# Patient Record
Sex: Female | Born: 2000 | Race: Black or African American | Hispanic: No | Marital: Single | Smoking: Never smoker
Health system: Southern US, Community
[De-identification: ages and names within clinical notes are randomized; demographics above are authoritative.]

## PROBLEM LIST (undated history)

## (undated) DIAGNOSIS — J45909 Unspecified asthma, uncomplicated: Secondary | ICD-10-CM

## (undated) HISTORY — DX: Unspecified asthma, uncomplicated: J45.909

## (undated) HISTORY — PX: WISDOM TOOTH EXTRACTION: SHX21

---
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
## 2019-03-26 ENCOUNTER — Ambulatory Visit (INDEPENDENT_AMBULATORY_CARE_PROVIDER_SITE_OTHER): Payer: Medicaid Other | Admitting: Family Medicine

## 2019-03-26 ENCOUNTER — Other Ambulatory Visit: Payer: Self-pay

## 2019-03-26 ENCOUNTER — Encounter: Payer: Self-pay | Admitting: Family Medicine

## 2019-03-26 VITALS — BP 100/60 | HR 71 | Temp 99.1°F | Resp 16 | Ht 64.0 in | Wt 120.2 lb

## 2019-03-26 DIAGNOSIS — J45990 Exercise induced bronchospasm: Secondary | ICD-10-CM | POA: Diagnosis not present

## 2019-03-26 DIAGNOSIS — Z025 Encounter for examination for participation in sport: Secondary | ICD-10-CM | POA: Diagnosis not present

## 2019-03-26 NOTE — Assessment & Plan Note (Signed)
Stable, uncomplicated with rare flare up only No other known asthma triggers On Albuterol PRN use  Plan Monitor asthma symptoms in future, if recurrent flare up in future limiting her function then may need additional therapy maintenance issue.

## 2019-03-26 NOTE — Progress Notes (Signed)
Subjective:    Patient ID: Barbara Barnes, female    DOB: September 04, 2001, 18 y.o.   MRN: [id number]  Barbara Barnes is a 18 y.o. female presenting on 03/26/2019 for [place] (physical forms)  Previous patient of [facility]. 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 They have requested Sports Physical and document completed prior to starting practice. - All standard sports physical questions completed per provided handout, only positive question answered yes was problem with breathing during exercise with known history of Asthma, mostly exercise induced but it is infrequent by report < 1 x month flare, usually mild and self resolved with use of albuterol PRN.  No known prior history of concussion, head trauma, significant known joint problem or fracture, surgery, chest pain, syncope related to exertion, family history of sudden cardiac death, seizures, among other questions (all negative)  Lifestyle Balanced diet Regular exercise, plays basketball Will be sophomore at [employer] this Fall 2020 Doing well in school She feels safe in relationships at home and school Denies any exposure to drugs tobacco alcohol On OCP for birth control, followed by [place] Maintenance: Will check [facility] Vaccine record - upon review today: Only missing Meningitis B Vaccine, optional, discussed briefly with patient and gave her info on this vaccine to consider it. She is UTD on Tdap and other vaccines at this time.  Depression screen PHQ 2/9 03/26/2019  Decreased Interest 0  Down, Depressed, Hopeless 0  PHQ - 2 Score 0    Past Medical History:  Diagnosis Date  . Asthma    Past Surgical History:  Procedure Laterality Date  . [place] EXTRACTION  2018  . WISDOM TOOTH EXTRACTION   2019   Social History   Socioeconomic History  . Marital status: Single    Spouse name: Not on file  . Number of children: Not on file  . Years of education: Not on file  . Highest education level: Not on file  Occupational History  . Not on file  Social Needs  . Financial resource strain: Not on file  . Food insecurity    Worry: Not on file    Inability: Not on file  . Transportation needs    Medical: Not on file    Non-medical: Not on file  Tobacco Use  . Smoking status: Never Smoker  . Smokeless tobacco: Never Used  Substance and Sexual Activity  . Alcohol use: No  . Drug use: No  . Sexual activity: Never    Birth control/protection: Pill    Comment: Never sexually active  Lifestyle  . Physical activity    Days per week: 7 days    Minutes per session: 120 min  . Stress: Not at all  Relationships  . Social connections    Talks on phone: More than three times a week    Gets together: More than three times a week    Attends religious service: More than 4 times per year    Active member of club or organization: Yes    Attends meetings of clubs or organizations: More than 4 times per year    Relationship status: Not on file  . Intimate partner violence    Fear of current or ex partner: No    Emotionally abused: No  Physically abused: No    Forced sexual activity: No  Other Topics Concern  . Not on file  Social History Narrative  . Not on file   History reviewed. No pertinent family history. Current Outpatient Medications on File Prior to Visit  Medication Sig  . albuterol (VENTOLIN HFA) 108 (90 Base) MCG/ACT inhaler Inhale 1 puff into the lungs every 4 (four) hours as needed for wheezing or shortness of breath.  . LO LOESTRIN FE 1 MG-10 MCG / 10 MCG tablet TAKE 1 TABLET BY MOUTH ONCE DAILY   No current facility-administered medications on file prior to visit.     Review of Systems  Constitutional: Negative for activity change, appetite change, chills,  diaphoresis, fatigue and fever.  HENT: Negative for congestion and hearing loss.   Eyes: Negative for visual disturbance.  Respiratory: Negative for cough, chest tightness, shortness of breath and wheezing.   Cardiovascular: Negative for chest pain, palpitations and leg swelling.  Gastrointestinal: Negative for abdominal pain, constipation, diarrhea, nausea and vomiting.  Genitourinary: Negative for dysuria, frequency and hematuria.  Musculoskeletal: Negative for arthralgias and neck pain.  Skin: Negative for rash.  Neurological: Negative for dizziness, weakness, light-headedness, numbness and headaches.  Hematological: Negative for adenopathy.  Psychiatric/Behavioral: Negative for behavioral problems, dysphoric mood and sleep disturbance.   Per HPI unless specifically indicated above      Objective:    BP 100/60   Pulse 71   Temp 99.1 F (37.3 C) (Oral)   Resp 16   Ht 5\' 4"  (1.626 m)   Wt 120 lb 3.2 oz (54.5 kg)   BMI 20.63 kg/m   Wt Readings from Last 3 Encounters:  03/26/19 120 lb 3.2 oz (54.5 kg) (37 %, Z= -0.34)*  09/10/18 123 lb (55.8 kg) (45 %, Z= -0.12)*  08/02/18 125 lb (56.7 kg) (50 %, Z= 0.00)*   * Growth percentiles are based on CDC (Girls, 2-20 Years) data.    Physical Exam Vitals signs and nursing note reviewed.  Constitutional:      General: She is not in acute distress.    Appearance: She is well-developed. She is not diaphoretic.     Comments: Well-appearing, comfortable, cooperative  HENT:     Head: Normocephalic and atraumatic.  Eyes:     General:        Right eye: No discharge.        Left eye: No discharge.     Conjunctiva/sclera: Conjunctivae normal.     Pupils: Pupils are equal, round, and reactive to light.  Neck:     Musculoskeletal: Normal range of motion and neck supple.     Thyroid: No thyromegaly.  Cardiovascular:     Rate and Rhythm: Normal rate and regular rhythm.     Heart sounds: Normal heart sounds. No murmur.  Pulmonary:      Effort: Pulmonary effort is normal. No respiratory distress.     Breath sounds: Normal breath sounds. No wheezing or rales.  Abdominal:     General: Bowel sounds are normal. There is no distension.     Palpations: Abdomen is soft. There is no mass.     Tenderness: There is no abdominal tenderness.  Musculoskeletal: Normal range of motion.        General: No tenderness.     Comments: Upper / Lower Extremities: - Normal muscle tone, strength bilateral upper extremities 5/5, lower extremities 5/5  Lymphadenopathy:     Cervical: No cervical adenopathy.  Skin:    General: Skin is  warm and dry.     Findings: No erythema or rash.  Neurological:     Mental Status: She is alert and oriented to person, place, and time.     Comments: Distal sensation intact to light touch all extremities  Psychiatric:        Behavior: Behavior normal.     Comments: Well groomed, good eye contact, normal speech and thoughts    Results for orders placed or performed during the hospital encounter of 11/23/17  CBC with Differential/Platelet  Result Value Ref Range   WBC 6.2 3.6 - 11.0 K/uL   RBC 4.32 3.80 - 5.20 MIL/uL   Hemoglobin 13.5 12.0 - 16.0 g/dL   HCT 16.140.2 09.635.0 - 04.547.0 %   MCV 93.1 80.0 - 100.0 fL   MCH 31.2 26.0 - 34.0 pg   MCHC 33.5 32.0 - 36.0 g/dL   RDW 40.913.1 81.111.5 - 91.414.5 %   Platelets 257 150 - 440 K/uL   Neutrophils Relative % 66 %   Neutro Abs 4.1 1.4 - 6.5 K/uL   Lymphocytes Relative 25 %   Lymphs Abs 1.5 1.0 - 3.6 K/uL   Monocytes Relative 9 %   Monocytes Absolute 0.5 0.2 - 0.9 K/uL   Eosinophils Relative 1 %   Eosinophils Absolute 0.0 0 - 0.7 K/uL   Basophils Relative 1 %   Basophils Absolute 0.0 0 - 0.1 K/uL  Miscellaneous [facility] test (send-out)  Result Value Ref Range   [facility] test code [id number]    [facility] test name HEMOGLOBIN SOLUBILITY    Misc [facility] result COMMENT       Assessment & Plan:   Problem List Items Addressed This Visit    Mild exercise-induced asthma    Stable,  uncomplicated with rare flare up only No other known asthma triggers On Albuterol PRN use  Plan Monitor asthma symptoms in future, if recurrent flare up in future limiting her function then may need additional therapy maintenance issue.      Relevant Medications   albuterol (VENTOLIN HFA) 108 (90 Base) MCG/ACT inhaler    Other Visit Diagnoses    Routine sports physical exam    -  Primary      Cleared to participate in Basketball now at school and additional sports within 1 year.  Completed form, signed, returned original to patient and we scanned copy.  Encouraged healthy balanced diet Emphasis on doing well in school, staying active Reviewed routine preventative topics for adolescent - Offered Meningitis B vaccine, return if interested   No orders of the defined types were placed in this encounter.   Follow up plan: Return in about 1 year (around 03/25/2020) for Sports physical in 1 year.  Saralyn PilarAlexander Sharol Croghan, DO [facility] 03/26/2019, 10:16 AM

## 2019-03-26 NOTE — Patient Instructions (Addendum)
Thank you for coming to the office today.  Completed sports physical today. You are cleared to participate in basketball. Good luck!  Let us know if any difficulty with asthma flare up or breathing, we can re order Albuterol inhaler or consider treatment options for preventing asthma flares in future, such as Singulair medication or daily inhaler medicine  We will check [place] Vaccine record to determine if due for any vaccines, if we identify any that are missing we can contact you and ask that you may come back for a vaccine or can be done at [facility] of residence.  Please schedule a Follow-up Appointment to: Return in about 1 year (around 03/25/2020) for Sports physical in 1 year.  If you have any other questions or concerns, please feel free to call the office or send a message through [place]. You may also schedule an earlier appointment if necessary.  Additionally, you may be receiving a survey about your experience at our office within a few days to 1 week by e-mail or mail. We value your feedback.  Nobie Putnam, DO [place]

## 2019-04-18 IMAGING — DX DG ANKLE COMPLETE 3+V*L*
3 series · 3 of 3 positions shown · non-contrast
Comparison: None.

CLINICAL DATA: Ankle injury playing basketball today.

EXAM:
LEFT ANKLE COMPLETE - 3+ VIEW

[ankle ap]
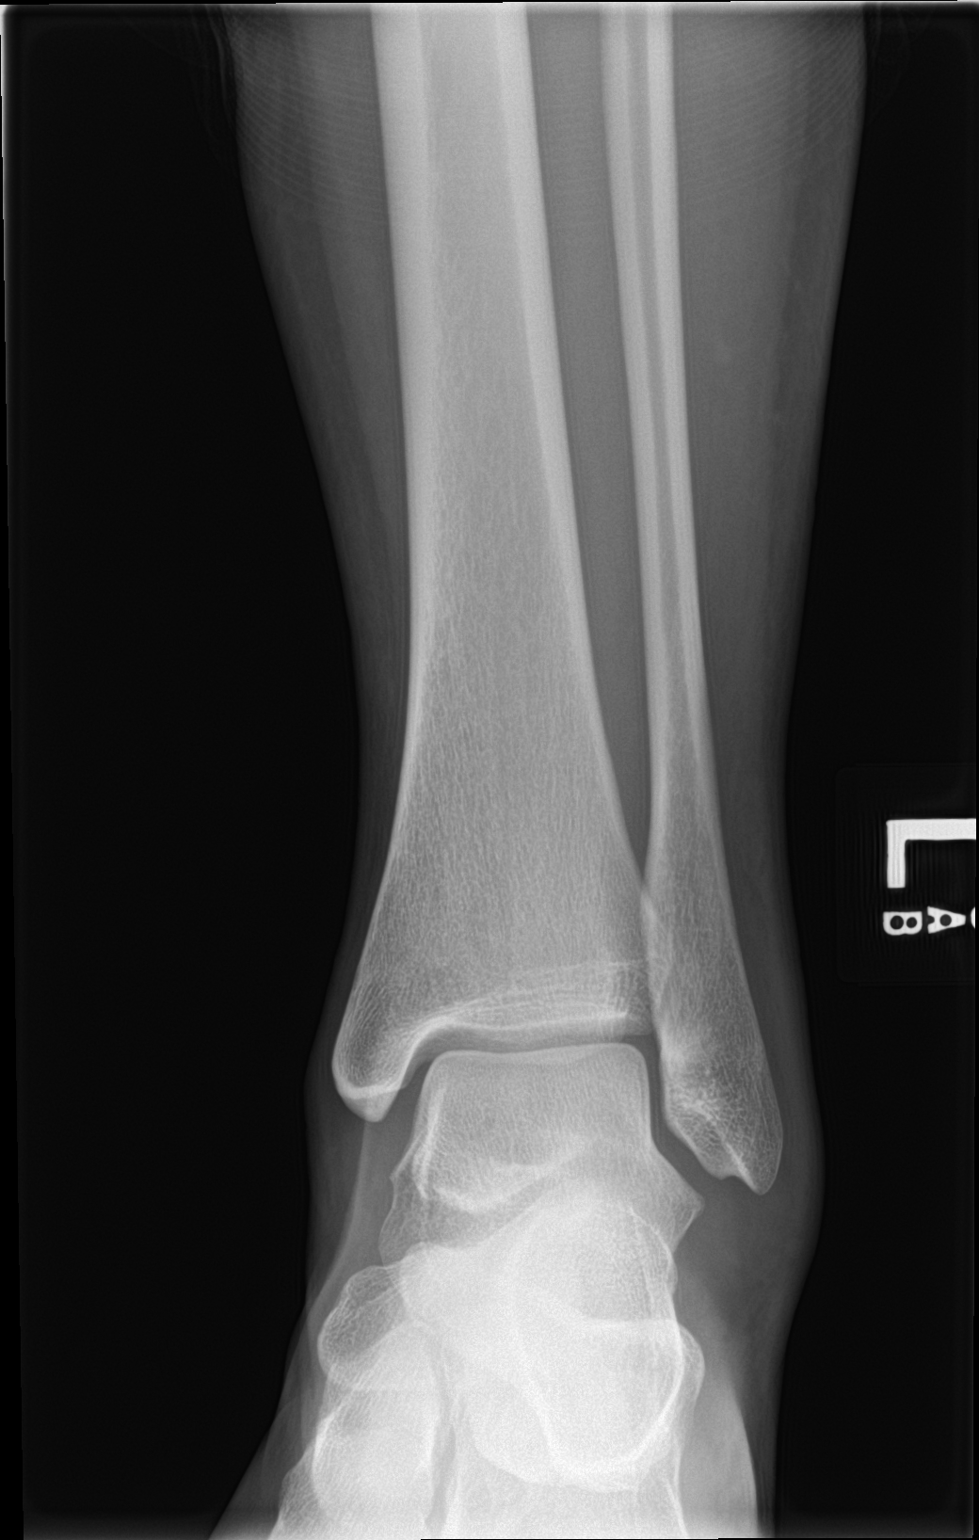

[ankle obl]
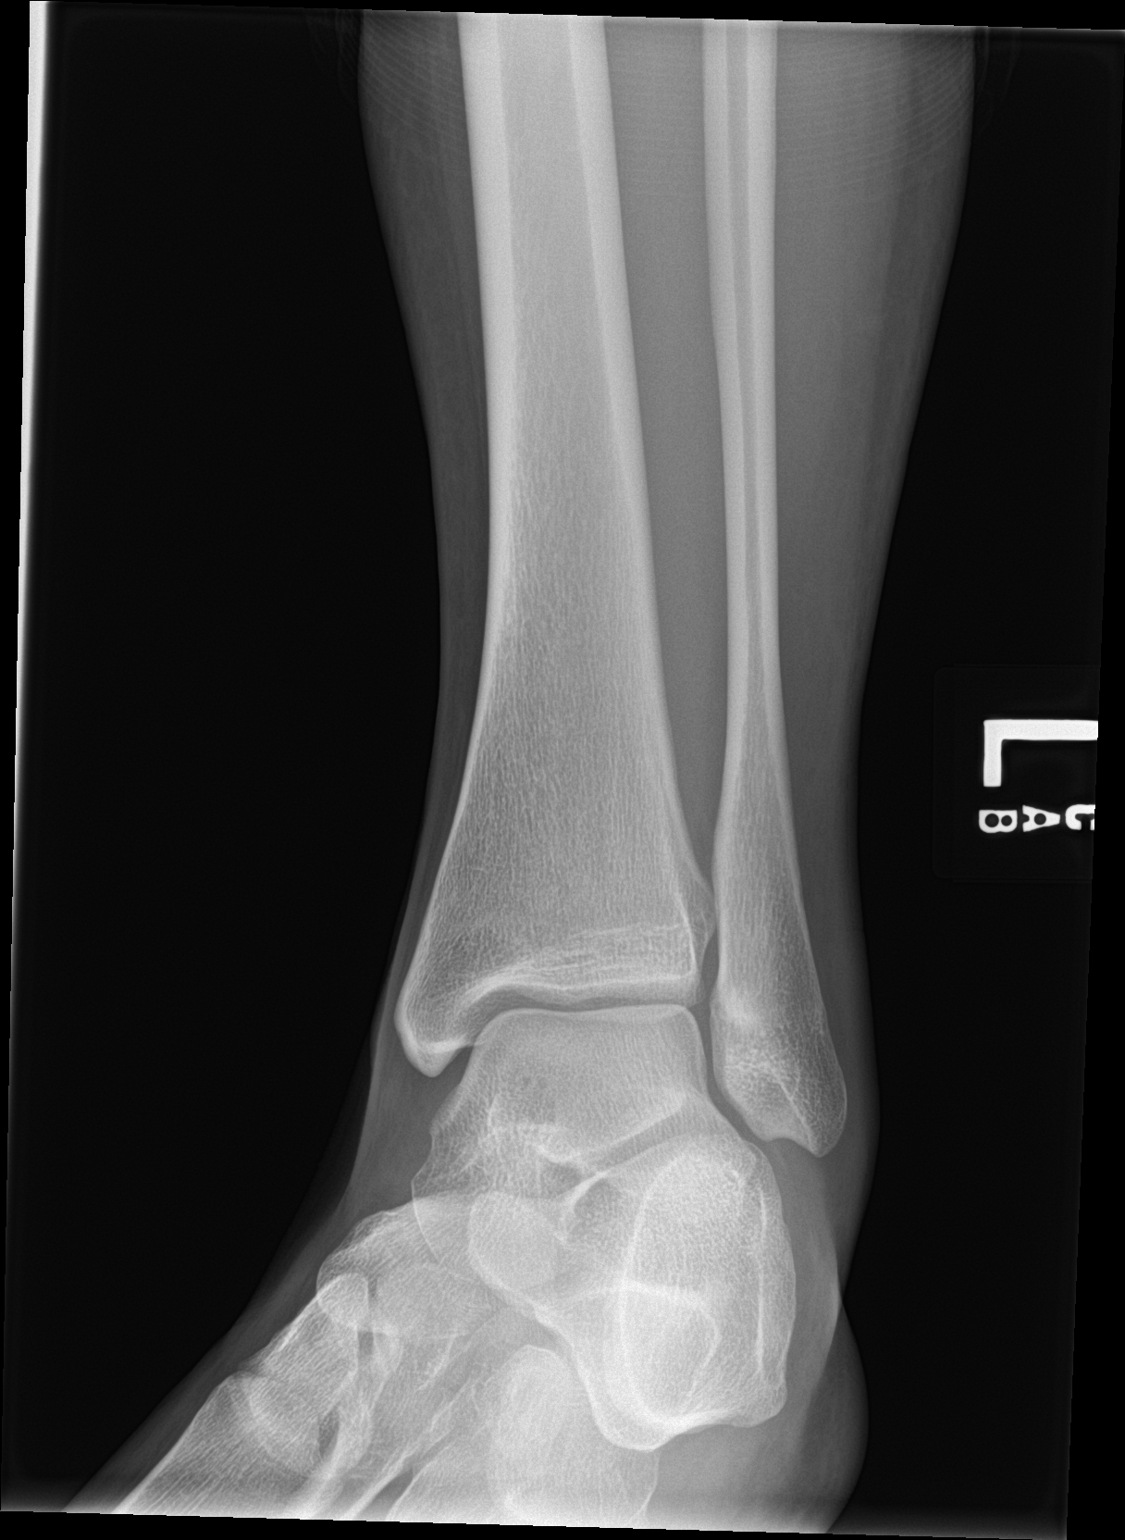

[ankle lat]
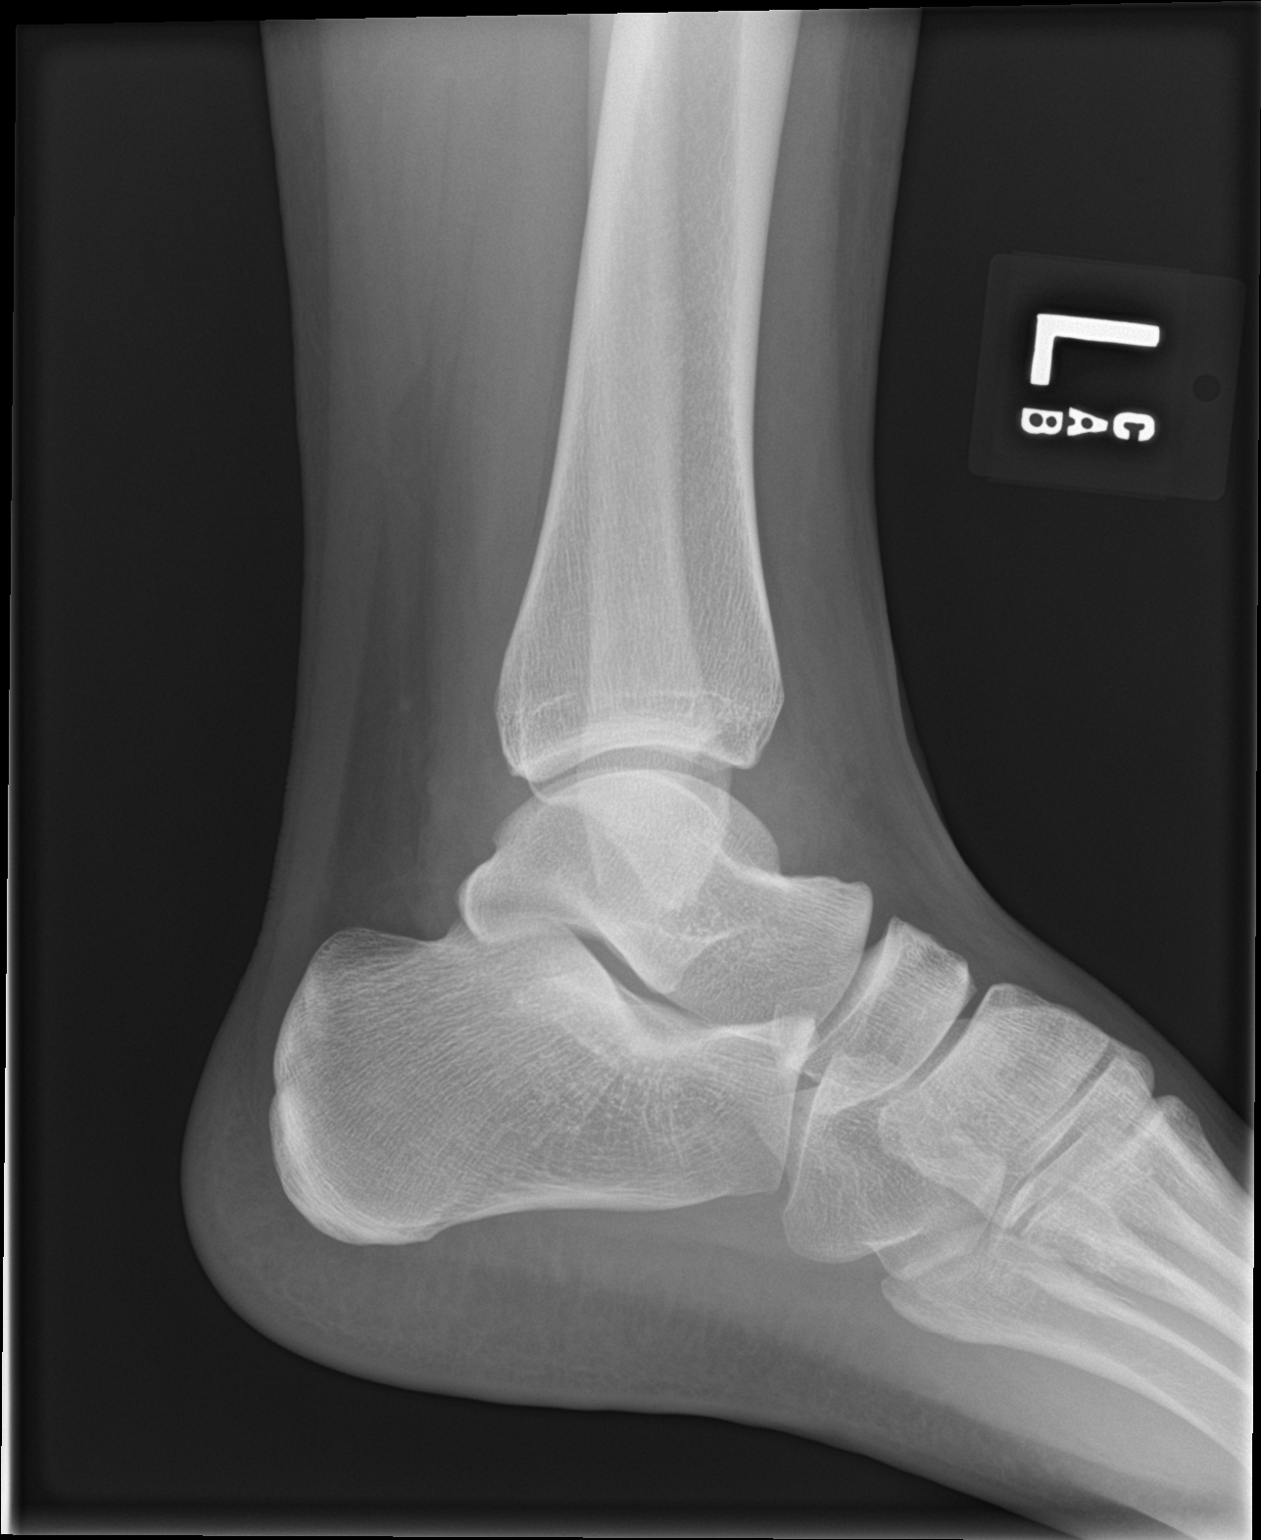

[3 of 3 positions shown; findings below may reference images not displayed]

FINDINGS: No fracture deformity nor dislocation. The ankle mortise appears
congruent and the tibiofibular syndesmosis intact. No destructive
bony lesions. Mild lateral ankle soft tissue swelling without
subcutaneous gas or radiopaque foreign bodies.
IMPRESSION: Soft tissue swelling, no acute osseous process.

## 2019-07-04 ENCOUNTER — Ambulatory Visit (INDEPENDENT_AMBULATORY_CARE_PROVIDER_SITE_OTHER): Payer: Medicaid Other | Admitting: Family Medicine

## 2019-07-04 ENCOUNTER — Other Ambulatory Visit: Payer: Self-pay

## 2019-07-04 ENCOUNTER — Encounter: Payer: Self-pay | Admitting: Family Medicine

## 2019-07-04 VITALS — BP 104/52 | HR 62 | Temp 98.4°F | Resp 16 | Ht 64.0 in | Wt 127.0 lb

## 2019-07-04 DIAGNOSIS — L301 Dyshidrosis [pompholyx]: Secondary | ICD-10-CM

## 2019-07-04 MED ORDER — TRIAMCINOLONE ACETONIDE 0.1 % EX CREA: 1.0000 "application " | TOPICAL_CREAM | Freq: Two times a day (BID) | CUTANEOUS | 1 refills | Status: DC | PRN

## 2019-07-04 NOTE — Progress Notes (Signed)
Subjective:    Patient ID: Barbara Barnes, female    DOB: [birth year], 18 y.o.   MRN: [id number]  Barbara Barnes is a 18 y.o. female presenting on 07/04/2019 for Rash (as per patient only on hand occassionally don't know how long she has it)   HPI   Bilateral Hand Rash - suspected Dyshidrotic Eczema Reports new issue onset over past several weeks unsure duration with bilateral hand having some red appearing dry skin bumps on hand mostly inner parts of fingers on side and dorsal fingers with itching and then it would dry out and flake some. Also had similar on feet before. - Regular hand hygiene with washing soap and water. She does use OTC moisturizer sometimes - Tried benadryl itch cream with some relief - History of asthma - Not tried Cortisone OTC Denies any fever chills spreading redness, pain, blister,     Depression screen [facility] 2/9 07/04/2019 03/26/2019  Decreased Interest 0 0  Down, Depressed, Hopeless 0 0  PHQ - 2 Score 0 0    Social History   Tobacco Use  . Smoking status: Never Smoker  . Smokeless tobacco: Never Used  Substance Use Topics  . Alcohol use: No  . Drug use: No    Review of Systems Per HPI unless specifically indicated above     Objective:    BP (!) 104/52   Pulse 62   Temp 98.4 F (36.9 C) (Oral)   Resp 16   Ht 5\' 4"  (1.626 m)   Wt 127 lb (57.6 kg)   BMI 21.80 kg/m   Wt Readings from Last 3 Encounters:  07/04/19 127 lb (57.6 kg) (49 %, Z= -0.02)*  03/26/19 120 lb 3.2 oz (54.5 kg) (37 %, Z= -0.34)*  09/10/18 123 lb (55.8 kg) (45 %, Z= -0.12)*   * Growth percentiles are based on CDC (Girls, 2-20 Years) data.    Physical Exam Vitals signs and nursing note reviewed.  Constitutional:      General: She is not in acute distress.    Appearance: She is well-developed. She is not diaphoretic.     Comments: Well-appearing, comfortable, cooperative  HENT:     Head: Normocephalic and atraumatic.  Eyes:     General:        Right eye: No  discharge.        Left eye: No discharge.     Conjunctiva/sclera: Conjunctivae normal.  Cardiovascular:     Rate and Rhythm: Normal rate.  Pulmonary:     Effort: Pulmonary effort is normal.  Skin:    General: Skin is warm and dry.     Findings: Rash (resolving scattered spots of dry flaking skin bilateral hands inner aspect of index finger and thumb, no erythema or raised rash) present. No erythema.  Neurological:     Mental Status: She is alert and oriented to person, place, and time.  Psychiatric:        Behavior: Behavior normal.     Comments: Well groomed, good eye contact, normal speech and thoughts    Results for orders placed or performed during the hospital encounter of 11/23/17  CBC with Differential/Platelet  Result Value Ref Range   WBC 6.2 3.6 - 11.0 K/uL   RBC 4.32 3.80 - 5.20 MIL/uL   Hemoglobin 13.5 12.0 - 16.0 g/dL   HCT 11/25/17 33.8 - 25.0 %   MCV 93.1 80.0 - 100.0 fL   MCH 31.2 26.0 - 34.0 pg   MCHC 33.5 32.0 - 36.0  g/dL   RDW 13.1 11.5 - 14.5 %   Platelets 257 150 - 440 K/uL   Neutrophils Relative % 66 %   Neutro Abs 4.1 1.4 - 6.5 K/uL   Lymphocytes Relative 25 %   Lymphs Abs 1.5 1.0 - 3.6 K/uL   Monocytes Relative 9 %   Monocytes Absolute 0.5 0.2 - 0.9 K/uL   Eosinophils Relative 1 %   Eosinophils Absolute 0.0 0 - 0.7 K/uL   Basophils Relative 1 %   Basophils Absolute 0.0 0 - 0.1 K/uL  Miscellaneous [facility] test (send-out)  Result Value Ref Range   [facility] test code [id number]    [facility] test name HEMOGLOBIN SOLUBILITY    Misc [facility] result COMMENT       Assessment & Plan:   Problem List Items Addressed This Visit    None    Visit Diagnoses    Dyshidrotic eczema    -  Primary   Relevant Medications   triamcinolone cream (KENALOG) 0.1 %      Clinically with hand dermatitis vs eczema type rash now resolving, but has episodic flare with itching No sign of infection Reassurance Trial topical steroid, rx Triamcinolone course Home eczema care -  moisturizing, tips AVS given If unresolved could consider topical antifungal based on appearance Followup if recurrence or worsening or new concerns  Meds ordered this encounter  Medications  . triamcinolone cream (KENALOG) 0.1 %    Sig: Apply 1 application topically 2 (two) times daily as needed. For 1-2 weeks only on affected areas    Dispense:  30 g    Refill:  1      Follow up plan: Return in about 2 weeks (around 07/18/2019), or if symptoms worsen or fail to improve, for rash.   Nobie Putnam, DO [facility] 07/04/2019, 12:03 PM

## 2019-07-04 NOTE — Patient Instructions (Addendum)
Thank you for coming to the office today.  The rash looks most consistent with eczema, this can flare up and get worse due to a variety of factors (excessive dry skin from bathing/showering, soaps, cold weather / indoor heaters, outdoor exposures).  Use the topical steroid creams twice a day for up to 1 week, maximum duration of use per one flare is 10 to 14 days, then STOP using it and allow skin to recover. Caution with over-use may cause lightening of the skin.   Hydrocortisone on face only and the Triamcinolone / Kenalog on body only.  Tips to reduce Eczema Flares: For baths/showers, limit bathing to every other day if you can (max 1 x daily)  Use a gentle, unscented soap and lukewarm water (hot water is most irritating to skin) Never scrub skin with too much pressure, this causes more irritation. Pat skin dry, then leave it slightly damp. DO NOT scrub it dry. Apply steroid cream to skin and rub in all the way, wait 15 min, then apply a daily moisturizer (Lubriderm, Eucerin, Aveeno). Continue daily moisturizer every day of the year (even after flare is resolved) - If you have eczema on hands or dry hands, recommend wearing any type of gloves overnight (cloth, fabric, or even nitrile/latex) to improve effect of topical moisturizer  If develops redness, honey colored crust oozing, drainage of pus, bleeding, or redness / swelling, pain, please return for re-evaluation, may have become infected after scratching.  If not resolving we can add an anti fungal medicine to the new cream if we order it.  Please schedule a Follow-up Appointment to: Return in about 2 weeks (around 07/18/2019), or if symptoms worsen or fail to improve, for rash.  If you have any other questions or concerns, please feel free to call the office or send a message through [place]. You may also schedule an earlier appointment if necessary.  Additionally, you may be receiving a survey about your experience at our office  within a few days to 1 week by e-mail or mail. We value your feedback.  Nobie Putnam, DO [place]

## 2019-08-06 ENCOUNTER — Other Ambulatory Visit: Payer: Self-pay | Admitting: Obstetrics and Gynecology

## 2019-08-06 NOTE — Telephone Encounter (Signed)
Pt overdue for annual.

## 2019-10-27 ENCOUNTER — Other Ambulatory Visit: Payer: Self-pay | Admitting: Obstetrics and Gynecology

## 2019-11-09 ENCOUNTER — Emergency Department
Admission: EM | Admit: 2019-11-09 | Discharge: 2019-11-10 | Disposition: A | Discharge status: Home / Self Care | Payer: Medicaid Other | Attending: Emergency Medicine | Admitting: Emergency Medicine

## 2019-11-09 ENCOUNTER — Other Ambulatory Visit: Payer: Self-pay

## 2019-11-09 DIAGNOSIS — B3731 Acute candidiasis of vulva and vagina: Secondary | ICD-10-CM

## 2019-11-09 DIAGNOSIS — B373 Candidiasis of vulva and vagina: Secondary | ICD-10-CM | POA: Diagnosis not present

## 2019-11-09 DIAGNOSIS — A6004 Herpesviral vulvovaginitis: Secondary | ICD-10-CM | POA: Diagnosis not present

## 2019-11-09 DIAGNOSIS — J45909 Unspecified asthma, uncomplicated: Secondary | ICD-10-CM | POA: Insufficient documentation

## 2019-11-09 DIAGNOSIS — R102 Pelvic and perineal pain: Secondary | ICD-10-CM | POA: Diagnosis present

## 2019-11-09 MED ORDER — IBUPROFEN 600 MG PO TABS
600.0000 mg | ORAL_TABLET | Freq: Once | ORAL | Status: AC
  Administered 2019-11-09: 600 mg via ORAL
  Filled 2019-11-09: qty 1

## 2019-11-09 NOTE — ED Triage Notes (Signed)
Patient c/o "tears to vagina" and vaginal pain since Wednesday. Patient has bruise to upper left lateral arm. Patient actively crying in triage. Patient denies sexual activity, sexual assault, and physical assault. Patient questioned as to how she received the bruise on her arm and the tears to her vagina and patient began to cry heavier and stated: "I don't know", while rocking back and forth on chair. Patient denies vaginal bleeding. Patient reports pale yellow thin vagina discharge starting yesterday. Patient denies urinary changes.

## 2019-11-10 LAB — CHLAMYDIA/NGC RT PCR (ARMC ONLY)
Chlamydia Tr: NOT DETECTED
N gonorrhoeae: NOT DETECTED

## 2019-11-10 LAB — WET PREP, GENITAL
Sperm: NONE SEEN
Trich, Wet Prep: NONE SEEN

## 2019-11-10 LAB — HIV ANTIBODY (ROUTINE TESTING W REFLEX): HIV Screen 4th Generation wRfx: NONREACTIVE

## 2019-11-10 LAB — RAPID HIV SCREEN (HIV 1/2 AB+AG)
HIV 1/2 Antibodies: NONREACTIVE
HIV-1 P24 Antigen - HIV24: NONREACTIVE

## 2019-11-10 MED ORDER — ACYCLOVIR 200 MG PO CAPS
800.0000 mg | ORAL_CAPSULE | Freq: Once | ORAL | Status: AC
  Administered 2019-11-10: 800 mg via ORAL
  Filled 2019-11-10: qty 4

## 2019-11-10 MED ORDER — ACYCLOVIR 400 MG PO TABS: 800.0000 mg | ORAL_TABLET | Freq: Two times a day (BID) | ORAL | 0 refills | Status: DC

## 2019-11-10 MED ORDER — IBUPROFEN 600 MG PO TABS: 600.0000 mg | ORAL_TABLET | Freq: Four times a day (QID) | ORAL | 0 refills | Status: DC | PRN

## 2019-11-10 MED ORDER — FLUCONAZOLE 50 MG PO TABS
150.0000 mg | ORAL_TABLET | Freq: Once | ORAL | Status: AC
  Administered 2019-11-10: 150 mg via ORAL
  Filled 2019-11-10: qty 1

## 2019-11-10 NOTE — ED Notes (Signed)
Dr. Don Perking at bedside with Stormy Fabian, EDT performing pelvic exam swab collections.

## 2019-11-10 NOTE — ED Provider Notes (Signed)
[facility] Emergency Department Provider Note  ____________________________________________  Time seen: Approximately 2:01 AM  I have reviewed the triage vital signs and the nursing notes.   HISTORY  Chief Complaint Vaginal Pain   HPI Barbara Barnes is a 19 y.o. female presents for evaluation of vaginal pain and vaginal discharge.  Patient reports that she has noticed some bumps in her vagina and also some small cuts.  She is sexually active.  She denies any abuse or vaginal trauma or sexual assault.  She reports that she first noticed the pain in the cuts 2 days ago after playing basketball.  She thought it was may be due to the clothing.  She also has had a thin yellow vaginal discharge for the last 2 days.  She reports that her pain in her vagina is sharp, severe, constant.  No abdominal pain, no nausea or vomiting, no vaginal bleeding, no fever or chills.  She denies pain with intercourse.   Past Medical History:  Diagnosis Date  . Asthma     Patient Active Problem List   Diagnosis Date Noted  . Mild exercise-induced asthma 03/26/2019    Past Surgical History:  Procedure Laterality Date  . WISDOM TOOTH EXTRACTION  2018  . WISDOM TOOTH EXTRACTION  2019    Prior to Admission medications   Medication Sig Start Date End Date Taking? Authorizing Provider  acyclovir (ZOVIRAX) 400 MG tablet Take 2 tablets (800 mg total) by mouth 2 (two) times daily for 5 days. 11/10/19 11/15/19  Nita Sickle, MD  albuterol (VENTOLIN HFA) 108 (90 Base) MCG/ACT inhaler Inhale 1 puff into the lungs every 4 (four) hours as needed for wheezing or shortness of breath.    [provider]  ibuprofen (ADVIL) 600 MG tablet Take 1 tablet (600 mg total) by mouth every 6 (six) hours as needed. 11/10/19   Don Perking, [place], MD  LO LOESTRIN FE 1 MG-10 MCG / 10 MCG tablet TAKE 1 TABLET BY MOUTH DAILY 10/27/19   Vena Austria, MD  triamcinolone cream (KENALOG) 0.1 % Apply  1 application topically 2 (two) times daily as needed. For 1-2 weeks only on affected areas 07/04/19   Smitty Cords, DO    Allergies Patient has no known allergies.  No family history on file.  Social History Social History   Tobacco Use  . Smoking status: Never Smoker  . Smokeless tobacco: Never Used  Substance Use Topics  . Alcohol use: No  . Drug use: No    Review of Systems  Constitutional: Negative for fever. Eyes: Negative for visual changes. ENT: Negative for sore throat. Neck: No neck pain  Cardiovascular: Negative for chest pain. Respiratory: Negative for shortness of breath. Gastrointestinal: Negative for abdominal pain, vomiting or diarrhea. Genitourinary: Negative for dysuria. + vaginal pain and lesions Musculoskeletal: Negative for back pain. Skin: Negative for rash. Neurological: Negative for headaches, weakness or numbness. Psych: No SI or HI  ____________________________________________   PHYSICAL EXAM:  VITAL SIGNS: ED Triage Vitals  Enc Vitals Group     BP 11/09/19 2133 (!) 156/79     Pulse Rate 11/09/19 2133 (!) 133     Resp 11/09/19 2133 (!) 22     Temp 11/09/19 2133 97.8 F (36.6 C)     Temp Source 11/09/19 2133 Oral     SpO2 11/09/19 2133 99 %     Weight 11/09/19 2134 128 lb (58.1 kg)     Height 11/09/19 2134 5\' 4"  (1.626 m)  Head Circumference --      Peak Flow --      Pain Score 11/09/19 2134 9     Pain Loc --      Pain Edu? --      Excl. in GC? --     Constitutional: Alert and oriented. Well appearing and in no apparent distress. HEENT:      Head: Normocephalic and atraumatic.         Eyes: Conjunctivae are normal. Sclera is non-icteric.       Mouth/Throat: Mucous membranes are moist.       Neck: Supple with no signs of meningismus. Cardiovascular: Regular rate and rhythm.  Respiratory: Normal respiratory effort.  Gastrointestinal: Soft, non tender, and non distended  Pelvic exam: Swollen bilateral labia with  several open ulcerations, a large linear ulceration, mucosa is friable, thick yellow discharge, erythematous and friable intra-vaginal mucosa. Os is closed, no CMT or adnexal tenderness.   Musculoskeletal: No edema, cyanosis, or erythema of extremities. Neurologic: Normal speech and language. Face is symmetric. Moving all extremities. No gross focal neurologic deficits are appreciated. Skin: Skin is warm, dry and intact. No rash noted. Psychiatric: Mood and affect are normal. Speech and behavior are normal.  ____________________________________________   LABS (all labs ordered are listed, but only abnormal results are displayed)  Labs Reviewed  WET PREP, GENITAL - Abnormal; Notable for the following components:      Result Value   Yeast Wet Prep HPF POC PRESENT (*)    Clue Cells Wet Prep HPF POC PRESENT (*)    WBC, Wet Prep HPF POC TOO NUMEROUS TO COUNT (*)    All other components within normal limits  CHLAMYDIA/NGC RT PCR ([facility] ONLY)  HSV CULTURE AND TYPING  RAPID HIV SCREEN (HIV 1/2 AB+AG)  HIV ANTIBODY (ROUTINE TESTING W REFLEX)  RPR   ____________________________________________  EKG  none  ____________________________________________  RADIOLOGY  none  ____________________________________________   PROCEDURES  Procedure(s) performed: None Procedures Critical Care performed:  None ____________________________________________   INITIAL IMPRESSION / ASSESSMENT AND PLAN / ED COURSE   19 y.o. female presents for evaluation of vaginal pain and vaginal discharge.  Pelvic exam as described above showing open ulcerations, friable mucosa, with thick vaginal discharge.  No CMT or adnexal tenderness.  Presentation concerning for herpes plus or minus GC, chlamydia, yeast infection.  Swabs are pending.  RPR and HIV also sent for full STD screening.  Ibuprofen for pain.  Patient was counseled on safe sex.   RPR and HSV pending and send out. Gonorrhea and chlamydia  negative. Wet prep positive for yeast. Patient treated with fluconazole and acyclovir. Discussed sfae sex and return precautions. Discussed f/u with PCP/     As part of my medical decision making, I reviewed the following data within the electronic MEDICAL RECORD NUMBER I have reviewed patient's previous medical records and PMH. Labs were reviewed by me and any abnormalities described in my A&P above. No IV meds given  Please note:  Patient was evaluated in Emergency Department today for the symptoms described in the history of present illness. Patient was evaluated in the context of the global COVID-19 pandemic, which necessitated consideration that the patient might be at risk for infection with the SARS-CoV-2 virus that causes COVID-19. Institutional protocols and algorithms that pertain to the evaluation of patients at risk for COVID-19 are in a state of rapid change based on information released by regulatory bodies including the CDC and federal and state organizations. These  policies and algorithms were followed during the patient's care in the ED.  Some ED evaluations and interventions may be delayed as a result of limited staffing during the pandemic.   ____________________________________________   FINAL CLINICAL IMPRESSION(S) / ED DIAGNOSES   Final diagnoses:  Herpes simplex vulvovaginitis  Vaginal yeast infection      NEW MEDICATIONS STARTED DURING THIS VISIT:  ED Discharge Orders         Ordered    acyclovir (ZOVIRAX) 400 MG tablet  2 times daily     11/10/19 0251    ibuprofen (ADVIL) 600 MG tablet  Every 6 hours PRN     11/10/19 0251           Note:  This document was prepared using Dragon voice recognition software and may include unintentional dictation errors.    Alfred Levins, [place], MD 11/10/19 [phone]

## 2019-11-11 LAB — RPR: RPR Ser Ql: NONREACTIVE

## 2019-11-12 LAB — POCT PREGNANCY, URINE: Preg Test, Ur: NEGATIVE

## 2019-11-13 LAB — HSV CULTURE AND TYPING

## 2019-11-15 ENCOUNTER — Telehealth: Payer: Self-pay | Admitting: Emergency Medicine

## 2019-11-15 NOTE — Telephone Encounter (Signed)
Called patient to give results of std tests.  hsv 1 is positive, rpr neg, hiv neg.  Left message asking her to call me.

## 2019-11-20 NOTE — Telephone Encounter (Signed)
Called again to inform of results and left voicemail.

## 2019-12-26 ENCOUNTER — Ambulatory Visit (INDEPENDENT_AMBULATORY_CARE_PROVIDER_SITE_OTHER): Payer: Medicaid Other | Admitting: Obstetrics and Gynecology

## 2019-12-26 ENCOUNTER — Other Ambulatory Visit: Payer: Self-pay

## 2019-12-26 ENCOUNTER — Encounter: Payer: Self-pay | Admitting: Obstetrics and Gynecology

## 2019-12-26 VITALS — BP 118/72 | Ht 64.0 in | Wt 115.0 lb

## 2019-12-26 DIAGNOSIS — Z01419 Encounter for gynecological examination (general) (routine) without abnormal findings: Secondary | ICD-10-CM

## 2019-12-26 DIAGNOSIS — Z3041 Encounter for surveillance of contraceptive pills: Secondary | ICD-10-CM

## 2019-12-26 DIAGNOSIS — Z0000 Encounter for general adult medical examination without abnormal findings: Secondary | ICD-10-CM | POA: Diagnosis not present

## 2019-12-26 MED ORDER — LO LOESTRIN FE 1 MG-10 MCG / 10 MCG PO TABS: 1.0000 | ORAL_TABLET | Freq: Every day | ORAL | 3 refills | Status: DC

## 2019-12-26 NOTE — Progress Notes (Signed)
Gynecology Annual Exam  PCP: Olin Hauser, DO  Chief Complaint:  Chief Complaint  Patient presents with  . Gynecologic Exam    History of Present Illness: Patient is a 19 y.o. G0P0000 presents for annual exam. The patient has no complaints today.   LMP: No LMP recorded. Average Interval: regular 28 days Duration of flow: 3 days Heavy Menses: no Clots: no Intermenstrual Bleeding: no Postcoital Bleeding: no Dysmenorrhea: no  She currently uses OCP (estrogen/progesterone) for contraception.  There is no notable family history of breast or ovarian cancer in her family.  The patient wears seatbelts: yes.  The patient has regular exercise: not asked.    The patient denies current symptoms of depression.    Review of Systems: Review of Systems  Constitutional: Negative for chills and fever.  HENT: Negative for congestion.   Respiratory: Negative for cough and shortness of breath.   Cardiovascular: Negative for chest pain and palpitations.  Gastrointestinal: Negative for abdominal pain, constipation, diarrhea, heartburn, nausea and vomiting.  Genitourinary: Negative for dysuria, frequency and urgency.  Skin: Negative for itching and rash.  Neurological: Negative for dizziness and headaches.  Endo/Heme/Allergies: Negative for polydipsia.  Psychiatric/Behavioral: Negative for depression.    Past Medical History:  Patient Active Problem List   Diagnosis Date Noted  . Mild exercise-induced asthma 03/26/2019  . Moderate ankle sprain, left, initial encounter 09/14/2018  . Pain 09/14/2018    Past Surgical History:  Past Surgical History:  Procedure Laterality Date  . [place] EXTRACTION  2018  . WISDOM TOOTH EXTRACTION  2019    Gynecologic History:  No LMP recorded. Contraception: OCP (estrogen/progesterone) Last Pap: Results were: NA < 21  Obstetric History: G0P0000  Family History:  No family history on file.  Social History:  Social History    Socioeconomic History  . Marital status: Single    Spouse name: Not on file  . Number of children: Not on file  . Years of education: Not on file  . Highest education level: Not on file  Occupational History  . Not on file  Tobacco Use  . Smoking status: Never Smoker  . Smokeless tobacco: Never Used  Substance and Sexual Activity  . Alcohol use: No  . Drug use: No  . Sexual activity: Never    Birth control/protection: Pill    Comment: Never sexually active  Other Topics Concern  . Not on file  Social History Narrative  . Not on file   Social Determinants of Health   Financial Resource Strain:   . Difficulty of Paying Living Expenses:   Food Insecurity:   . Worried About Charity fundraiser in the Last Year:   . Arboriculturist in the Last Year:   Transportation Needs:   . Film/video editor (Medical):   Marland Kitchen Lack of Transportation (Non-Medical):   Physical Activity:   . Days of Exercise per Week:   . Minutes of Exercise per Session:   Stress:   . Feeling of Stress :   Social Connections:   . Frequency of Communication with Friends and Family:   . Frequency of Social Gatherings with Friends and Family:   . Attends Religious Services:   . Active Member of Clubs or Organizations:   . Attends Archivist Meetings:   Marland Kitchen Marital Status:   Intimate Partner Violence:   . Fear of Current or Ex-Partner:   . Emotionally Abused:   Marland Kitchen Physically Abused:   .  Sexually Abused:     Allergies:  No Known Allergies  Medications: Prior to Admission medications   Medication Sig Start Date End Date Taking? Authorizing Provider  albuterol (VENTOLIN HFA) 108 (90 Base) MCG/ACT inhaler Inhale 1 puff into the lungs every 4 (four) hours as needed for wheezing or shortness of breath.   Yes [provider]  ibuprofen (ADVIL) 600 MG tablet Take 1 tablet (600 mg total) by mouth every 6 (six) hours as needed. 11/10/19  Yes Veronese, [place], MD  triamcinolone cream  (KENALOG) 0.1 % Apply 1 application topically 2 (two) times daily as needed. For 1-2 weeks only on affected areas 07/04/19  Yes Karamalegos, Alexander J, DO  LO LOESTRIN FE 1 MG-10 MCG / 10 MCG tablet TAKE 1 TABLET BY MOUTH DAILY Patient not taking: Reported on 12/26/2019 10/27/19   Vena Austria, MD    Physical Exam Vitals: Blood pressure 118/72, height 5\' 4"  (1.626 m), weight 115 lb (52.2 kg).  General: NAD, well nourished, appears stated age HEENT: normocephalic, anicteric Pulmonary: No increased work of breathing, CTAB Cardiovascular: RRR, distal pulses 2+ Abdomen: NABS, soft, non-tender, non-distended.  Umbilicus without lesions.  No hepatomegaly, splenomegaly or masses palpable. No evidence of hernia  Extremities: no edema, erythema, or tenderness Neurologic: Grossly intact Psychiatric: mood appropriate, affect full  Female chaperone present for pelvic and breast  portions of the physical exam    Assessment: 19 y.o. G0P0000 routine annual exam  Plan: Problem List Items Addressed This Visit    None    Visit Diagnoses    Surveillance for birth control, oral contraceptives    -  Primary   Encounter for gynecological examination without abnormal finding          1) 4) Gardasil Series discussed and if applicable offered to patient - Patient has not previously completed 3 shot series   2) STI screening  was notoffered and therefore not obtained - UTD 11/2019  3)  ASCCP guidelines and rational discussed.  Start at age 74  4) Contraception - the patient is currently using  OCP (estrogen/progesterone).  She is happy with her current form of contraception and plans to continue We discussed safe sex practices to reduce her furture risk of STI's.    5) Return in about 1 year (around 12/25/2020) for annual.   12/27/2020, MD, Vena Austria [facility], [facility] 12/26/2019, 3:40 PM

## 2020-04-02 ENCOUNTER — Encounter: Payer: Medicaid Other | Admitting: Family Medicine

## 2020-04-02 ENCOUNTER — Other Ambulatory Visit: Payer: Self-pay

## 2020-04-03 ENCOUNTER — Encounter: Payer: Medicaid Other | Admitting: Family Medicine

## 2020-04-07 ENCOUNTER — Encounter: Payer: Self-pay | Admitting: Family Medicine

## 2020-04-07 ENCOUNTER — Ambulatory Visit (INDEPENDENT_AMBULATORY_CARE_PROVIDER_SITE_OTHER): Payer: Medicaid Other | Admitting: Family Medicine

## 2020-04-07 ENCOUNTER — Other Ambulatory Visit: Payer: Self-pay

## 2020-04-07 VITALS — BP 99/46 | HR 91 | Temp 98.4°F | Ht 64.0 in | Wt 114.0 lb

## 2020-04-07 DIAGNOSIS — Z0000 Encounter for general adult medical examination without abnormal findings: Secondary | ICD-10-CM | POA: Diagnosis not present

## 2020-04-07 DIAGNOSIS — R634 Abnormal weight loss: Secondary | ICD-10-CM | POA: Diagnosis not present

## 2020-04-07 DIAGNOSIS — J45990 Exercise induced bronchospasm: Secondary | ICD-10-CM | POA: Diagnosis not present

## 2020-04-07 MED ORDER — ALBUTEROL SULFATE HFA 108 (90 BASE) MCG/ACT IN AERS: 1.0000 | INHALATION_SPRAY | RESPIRATORY_TRACT | 1 refills | Status: DC | PRN

## 2020-04-07 NOTE — Assessment & Plan Note (Signed)
Reports 13lb weight gain since last visit in October 2020.  Has noticed she is eating a bit less and has increased her exercise.  Will order baseline labs for evaluation of CBC, CMP and thyroid.  Plan: 1. Have labs drawn today for evaluation of CBC, CMP and thyroid, is fasting today. 2. If having continued weight loss, will refer to nutrition/dietary for assistance with increasing caloric intake

## 2020-04-07 NOTE — Patient Instructions (Addendum)
As we discussed, have your labs drawn in the next 1-2 weeks and we will contact you with the results.  Well Visit: Care Instructions Overview  Well visits can help you stay healthy. Your provider has checked your overall health and may have suggested ways to take good care of yourself. Your provider also may have recommended tests. At home, you can help prevent illness with healthy eating, regular exercise, and other steps.  Follow-up care is a key part of your treatment and safety. Be sure to make and go to all appointments, and call your provider if you are having problems. It's also a good idea to know your test results and keep a list of the medicines you take.  How can you care for yourself at home?   Get screening tests that you and your doctor decide on. Screening helps find diseases before any symptoms appear.   Eat healthy foods. Choose fruits, vegetables, whole grains, protein, and low-fat dairy foods. Limit fat, especially saturated fat. Reduce salt in your diet.   Limit alcohol. If you are a man, have no more than 2 drinks a day or 14 drinks a week. If you are a woman, have no more than 1 drink a day or 7 drinks a week.   Get at least 30 minutes of physical activity on most days of the week.  We recommend you go no more than 2 days in a row without exercise. Walking is a good choice. You also may want to do other activities, such as running, swimming, cycling, or playing tennis or team sports. Discuss any changes in your exercise program with your provider.   Reach and stay at a healthy weight. This will lower your risk for many problems, such as obesity, diabetes, heart disease, and high blood pressure.   Do not smoke or allow others to smoke around you. If you need help quitting, talk to your provider about stop-smoking programs and medicines. These can increase your chances of quitting for good.  Can call 1-800-QUIT-NOW ((548)608-2431) for the [facility],  assistance with smoking cessation.   Care for your mental health. It is easy to get weighed down by worry and stress. Learn strategies to manage stress, like deep breathing and mindfulness, and stay connected with your family and community. If you find you often feel sad or hopeless, talk with your provider. Treatment can help.   Talk to your provider about whether you have any risk factors for sexually transmitted infections (STIs). You can help prevent STIs if you wait to have sex with a new partner (or partners) until you've each been tested for STIs. It also helps if you use condoms (female or female condoms) and if you limit your sex partners to one person who only has sex with you. Vaccines are available for some STIs, such as HPV (these are age dependent).   Use birth control if it's important to you to prevent pregnancy. Talk with your provider about the choices available and what might be best for you.   If you think you may have a problem with alcohol or drug use, talk to your provider. This includes prescription medicines (such as amphetamines and opioids) and illegal drugs (such as cocaine and methamphetamine). Your provider can help you figure out what type of treatment is best for you.   If you have concerns about domestic violence or intimate partner violence, there are resources available to you. National Domestic Abuse Hotline 5012782412   Protect your  skin from too much sun. When you're outdoors from 10 a.m. to 4 p.m., stay in the shade or cover up with clothing and a hat with a wide brim. Wear sunglasses that block UV rays. Even when it's cloudy, put broad-spectrum sunscreen (SPF 30 or higher) on any exposed skin.   See a dentist one or two times a year for checkups and to have your teeth cleaned.   See an eye doctor once per year for an eye exam.   Wear a seat belt in the car.  When should you call for help?  Watch closely for changes in your health, and be sure to  contact your provider if you have any problems or symptoms that concern you.  We will plan to see you back in 1 year for your physical  You will receive a survey after today's visit either digitally by e-mail or paper by USPS mail. Your experiences and feedback matter to [place].  Please respond so we know how we are doing as we provide care for you.  Call us with any questions/concerns/needs.  It is my goal to be available to you for your health concerns.  Thanks for choosing me to be a partner in your healthcare needs!  Charlaine Dalton, FNP-C Family Nurse Practitioner [facility] Phone: [phone]

## 2020-04-07 NOTE — Assessment & Plan Note (Signed)
Annual physical exam without new findings.  Sports physical forms for basketball completed and provided copy to patient.  Plan: 1. Obtain health maintenance screenings as above according to age. - Increase physical activity to 30 minutes most days of the week.  - Eat healthy diet high in vegetables and fruits; low in refined carbohydrates. - Screening labs and tests as ordered 2. Return 1 year for annual physical.

## 2020-04-07 NOTE — Progress Notes (Signed)
Subjective:    Patient ID: Barbara Barnes, female    DOB: Apr 12, 2001, 19 y.o.   MRN: [id number]  Barbara CosDionne Pech is a 19 y.o. female presenting on 04/07/2020 for Annual Exam   HPI  HEALTH MAINTENANCE:  Weight/BMI: BMI 19.57, 13lb weight loss since 06/2019 visit Physical activity: Plays basketball and regularly stays active Diet: Regular Seatbelt: Always Sunscreen: Not wearing when in the sun currently HIV & Hep C Screening: Offered and declined GC/CT: Completed: 11/09/19  Optometry: Yearly Dentistry: Yearly  IMMUNIZATIONS: Influenza: Due next season Tetanus: Up to date 04/27/2011 COVID: Reports had in June, will send dates of vaccination and manufacturer to us after returning home and obtaining COVID vaccination card  [facility] 07/04/2019 03/26/2019  Decreased Interest 0 0  Down, Depressed, Hopeless 0 0  PHQ - 2 Score 0 0    Past Medical History:  Diagnosis Date  . Asthma    Past Surgical History:  Procedure Laterality Date  . WISDOM TOOTH EXTRACTION  2018  . WISDOM TOOTH EXTRACTION  2019   Social History   Socioeconomic History  . Marital status: Single    Spouse name: Not on file  . Number of children: Not on file  . Years of education: Not on file  . Highest education level: Not on file  Occupational History  . Not on file  Tobacco Use  . Smoking status: Never Smoker  . Smokeless tobacco: Never Used  Vaping Use  . Vaping Use: Never used  Substance and Sexual Activity  . Alcohol use: No  . Drug use: No  . Sexual activity: Never    Birth control/protection: Pill    Comment: Never sexually active  Other Topics Concern  . Not on file  Social History Narrative  . Not on file   Social Determinants of Health   Financial Resource Strain:   . Difficulty of Paying Living Expenses:   Food Insecurity:   . Worried About Programme researcher, broadcasting/film/videounning Out of Food in the Last Year:   . Baristaan Out of Food in the Last Year:   Transportation Needs:   . Automotive engineerLack of  Transportation (Medical):   Marland Kitchen. Lack of Transportation (Non-Medical):   Physical Activity:   . Days of Exercise per Week:   . Minutes of Exercise per Session:   Stress:   . Feeling of Stress :   Social Connections:   . Frequency of Communication with Friends and Family:   . Frequency of Social Gatherings with Friends and Family:   . Attends Religious Services:   . Active Member of Clubs or Organizations:   . Attends BankerClub or Organization Meetings:   Marland Kitchen. Marital Status:   Intimate Partner Violence:   . Fear of Current or Ex-Partner:   . Emotionally Abused:   Marland Kitchen. Physically Abused:   . Sexually Abused:    History reviewed. No pertinent family history. Current Outpatient Medications on File Prior to Visit  Medication Sig  . ibuprofen (ADVIL) 600 MG tablet Take 1 tablet (600 mg total) by mouth every 6 (six) hours as needed.  . Norethindrone-Ethinyl Estradiol-Fe Biphas (LO LOESTRIN FE) 1 MG-10 MCG / 10 MCG tablet Take 1 tablet by mouth daily.  Marland Kitchen. triamcinolone cream (KENALOG) 0.1 % Apply 1 application topically 2 (two) times daily as needed. For 1-2 weeks only on affected areas   No current facility-administered medications on file prior to visit.    Per HPI unless specifically indicated above     Objective:  BP (!) 99/46 (BP Location: Left Arm, Patient Position: Sitting, Cuff Size: Normal)   Pulse 91   Temp 98.4 F (36.9 C) (Oral)   Ht 5\' 4"  (1.626 m)   Wt 114 lb (51.7 kg)   BMI 19.57 kg/m   Wt Readings from Last 3 Encounters:  04/07/20 114 lb (51.7 kg)  12/26/19 115 lb (52.2 kg) (24 %, Z= -0.71)*  11/09/19 128 lb (58.1 kg) (50 %, Z= 0.00)*   * Growth percentiles are based on CDC (Girls, 2-20 Years) data.    Physical Exam Vitals reviewed.  Constitutional:      General: She is not in acute distress.    Appearance: Normal appearance. She is well-developed, well-groomed and normal weight. She is not ill-appearing or toxic-appearing.  HENT:     Head: Normocephalic and  atraumatic.     Right Ear: Tympanic membrane, ear canal and external ear normal. There is no impacted cerumen.     Left Ear: Tympanic membrane, ear canal and external ear normal. There is no impacted cerumen.     Nose: Nose normal. No congestion or rhinorrhea.     Mouth/Throat:     Lips: Pink.     Mouth: Mucous membranes are moist.     Pharynx: Oropharynx is clear. Uvula midline. No oropharyngeal exudate or posterior oropharyngeal erythema.  Eyes:     General: Lids are normal. Vision grossly intact. No scleral icterus.       Right eye: No discharge.        Left eye: No discharge.     Extraocular Movements: Extraocular movements intact.     Conjunctiva/sclera: Conjunctivae normal.     Pupils: Pupils are equal, round, and reactive to light.  Neck:     Thyroid: No thyroid mass or thyromegaly.  Cardiovascular:     Rate and Rhythm: Normal rate and regular rhythm.     Pulses: Normal pulses.          Dorsalis pedis pulses are 2+ on the right side and 2+ on the left side.     Heart sounds: Normal heart sounds. No murmur heard.  No friction rub. No gallop.   Pulmonary:     Effort: Pulmonary effort is normal. No respiratory distress.     Breath sounds: Normal breath sounds.  Abdominal:     General: Abdomen is flat. Bowel sounds are normal. There is no distension.     Palpations: Abdomen is soft. There is no hepatomegaly, splenomegaly or mass.     Tenderness: There is no abdominal tenderness. There is no guarding or rebound.     Hernia: No hernia is present.  Genitourinary:    Comments: Deferred Musculoskeletal:        General: Normal range of motion.     Cervical back: Normal range of motion and neck supple. No tenderness.     Right lower leg: No edema.     Left lower leg: No edema.     Comments: Normal tone, strength 5/5 BUE & BLE  Feet:     Right foot:     Skin integrity: Skin integrity normal.     Left foot:     Skin integrity: Skin integrity normal.  Lymphadenopathy:      Cervical: No cervical adenopathy.  Skin:    General: Skin is warm and dry.     Capillary Refill: Capillary refill takes less than 2 seconds.  Neurological:     General: No focal deficit present.     Mental Status:  She is alert and oriented to person, place, and time.     Cranial Nerves: No cranial nerve deficit.     Sensory: No sensory deficit.     Motor: No weakness.     Coordination: Coordination normal.     Gait: Gait normal.     Deep Tendon Reflexes: Reflexes normal.  Psychiatric:        Attention and Perception: Attention and perception normal.        Mood and Affect: Mood and affect normal.        Speech: Speech normal.        Behavior: Behavior normal. Behavior is cooperative.        Thought Content: Thought content normal.        Cognition and Memory: Cognition and memory normal.        Judgment: Judgment normal.     Results for orders placed or performed during the hospital encounter of 11/09/19  Wet prep, genital  Result Value Ref Range   Yeast Wet Prep HPF POC PRESENT (A) NONE SEEN   Trich, Wet Prep NONE SEEN NONE SEEN   Clue Cells Wet Prep HPF POC PRESENT (A) NONE SEEN   WBC, Wet Prep HPF POC TOO NUMEROUS TO COUNT (A) NONE SEEN   Sperm NONE SEEN   Chlamydia/NGC rt PCR ([facility] only)  Result Value Ref Range   Specimen source GC/Chlam ENDOCERVICAL    Chlamydia Tr NOT DETECTED NOT DETECTED   N gonorrhoeae NOT DETECTED NOT DETECTED  Hsv Culture And Typing   Specimen: Other  Result Value Ref Range   HSV Culture/Type Comment (A)    Source of Sample VAGINAL/RECTAL   HIV Antibody (routine testing w rflx)  Result Value Ref Range   HIV Screen 4th Generation wRfx NON REACTIVE NON REACTIVE  Rapid HIV screen (HIV 1/2 Ab+Ag) ([facility] Only)  Result Value Ref Range   HIV-1 P24 Antigen - HIV24 NON REACTIVE NON REACTIVE   HIV 1/2 Antibodies NON REACTIVE NON REACTIVE   Interpretation (HIV Ag Ab)      A non reactive test result means that HIV 1 or HIV 2 antibodies and HIV 1 p24  antigen were not detected in the specimen.  RPR  Result Value Ref Range   RPR Ser Ql NON REACTIVE NON REACTIVE  Pregnancy, urine POC  Result Value Ref Range   Preg Test, Ur NEGATIVE NEGATIVE      Assessment & Plan:   Problem List Items Addressed This Visit      Respiratory   Mild exercise-induced asthma    Stable, uncomplicated with rare flare ups only when playing basketball.  No other triggers. On albuteral HFA PRN.  Has one at home currently, sent in new Rx so can have one at school as well.  Plan: 1. Refill on albuterol HFA inhaler sent to pharmacy on file 2. If beginning to use albuterol inhaler more than 2x per week to RTC for re-evaluation and possible addition/change in maintenance medications.      Relevant Medications   albuterol (VENTOLIN HFA) 108 (90 Base) MCG/ACT inhaler     Other   Routine medical exam - Primary    Annual physical exam without new findings.  Sports physical forms for basketball completed and provided copy to patient.  Plan: 1. Obtain health maintenance screenings as above according to age. - Increase physical activity to 30 minutes most days of the week.  - Eat healthy diet high in vegetables and fruits; low in refined carbohydrates. -  Screening labs and tests as ordered 2. Return 1 year for annual physical.       Relevant Orders   CBC with Differential   COMPLETE METABOLIC PANEL WITH GFR   Thyroid Panel With TSH   Weight loss    Reports 13lb weight gain since last visit in October 2020.  Has noticed she is eating a bit less and has increased her exercise.  Will order baseline labs for evaluation of CBC, CMP and thyroid.  Plan: 1. Have labs drawn today for evaluation of CBC, CMP and thyroid, is fasting today. 2. If having continued weight loss, will refer to nutrition/dietary for assistance with increasing caloric intake      Relevant Orders   CBC with Differential   COMPLETE METABOLIC PANEL WITH GFR   Thyroid Panel With TSH        Meds ordered this encounter  Medications  . albuterol (VENTOLIN HFA) 108 (90 Base) MCG/ACT inhaler    Sig: Inhale 1-2 puffs into the lungs every 4 (four) hours as needed for wheezing or shortness of breath.    Dispense:  6.7 g    Refill:  1      Follow up plan: Return in about 1 year (around 04/07/2021) for CPE.  Charlaine Dalton, FNP-C Family Nurse Practitioner [facility] 04/07/2020, 10:23 AM

## 2020-04-07 NOTE — Assessment & Plan Note (Signed)
Stable, uncomplicated with rare flare ups only when playing basketball.  No other triggers. On albuteral HFA PRN.  Has one at home currently, sent in new Rx so can have one at school as well.  Plan: 1. Refill on albuterol HFA inhaler sent to pharmacy on file 2. If beginning to use albuterol inhaler more than 2x per week to RTC for re-evaluation and possible addition/change in maintenance medications.

## 2020-04-08 LAB — COMPLETE METABOLIC PANEL WITH GFR
AG Ratio: 1.9 (calc) (ref 1.0–2.5)
ALT: 10 U/L (ref 6–29)
AST: 16 U/L (ref 10–30)
Albumin: 4.2 g/dL (ref 3.6–5.1)
Alkaline phosphatase (APISO): 37 U/L (ref 31–125)
BUN: 9 mg/dL (ref 7–25)
CO2: 30 mmol/L (ref 20–32)
Calcium: 9.3 mg/dL (ref 8.6–10.2)
Chloride: 105 mmol/L (ref 98–110)
Creat: 0.76 mg/dL (ref 0.50–1.10)
GFR, Est African American: 131 mL/min/{1.73_m2} (ref >=60)
GFR, Est Non African American: 113 mL/min/{1.73_m2} (ref >=60)
Globulin: 2.2 g/dL (calc) (ref 1.9–3.7)
Glucose, Bld: 83 mg/dL (ref 65–99)
Potassium: 4.2 mmol/L (ref 3.5–5.3)
Sodium: 140 mmol/L (ref 135–146)
Total Bilirubin: 1.2 mg/dL (ref 0.2–1.2)
Total Protein: 6.4 g/dL (ref 6.1–8.1)

## 2020-04-08 LAB — THYROID PANEL WITH TSH
Free Thyroxine Index: 2.5 (ref 1.4–3.8)
T3 Uptake: 29 % (ref 22–35)
T4, Total: 8.5 ug/dL (ref 5.3–11.7)
TSH: 0.67 mIU/L

## 2020-04-08 LAB — CBC WITH DIFFERENTIAL/PLATELET
Absolute Monocytes: 271 cells/uL (ref 200–950)
Basophils Absolute: 30 cells/uL (ref 0–200)
Basophils Relative: 0.7 %
Eosinophils Absolute: 39 cells/uL (ref 15–500)
Eosinophils Relative: 0.9 %
HCT: 40.2 % (ref 35.0–45.0)
Hemoglobin: 12.9 g/dL (ref 11.7–15.5)
Lymphs Abs: 1995 cells/uL (ref 850–3900)
MCH: 29.5 pg (ref 27.0–33.0)
MCHC: 32.1 g/dL (ref 32.0–36.0)
MCV: 92 fL (ref 80.0–100.0)
MPV: 10 fL (ref 7.5–12.5)
Monocytes Relative: 6.3 %
Neutro Abs: 1965 cells/uL (ref 1500–7800)
Neutrophils Relative %: 45.7 %
Platelets: 280 10*3/uL (ref 140–400)
RBC: 4.37 10*6/uL (ref 3.80–5.10)
RDW: 12.1 % (ref 11.0–15.0)
Total Lymphocyte: 46.4 %
WBC: 4.3 10*3/uL (ref 3.8–10.8)

## 2020-09-24 DIAGNOSIS — U071 COVID-19: Secondary | ICD-10-CM | POA: Diagnosis not present

## 2020-10-15 ENCOUNTER — Other Ambulatory Visit: Payer: Self-pay | Admitting: Obstetrics and Gynecology

## 2020-10-15 ENCOUNTER — Telehealth: Payer: Self-pay

## 2020-10-15 MED ORDER — ACYCLOVIR 400 MG PO TABS: 800.0000 mg | ORAL_TABLET | Freq: Two times a day (BID) | ORAL | 1 refills | Status: AC

## 2020-10-15 NOTE — Telephone Encounter (Signed)
Rx RF eRxd.  

## 2020-10-15 NOTE — Progress Notes (Signed)
Rx RF acyclovir prn HSV 

## 2020-10-15 NOTE — Telephone Encounter (Signed)
Mom aware.

## 2020-10-15 NOTE — Telephone Encounter (Signed)
Pt's mom, Barbara Barnes, calling; pharm told her to call us as they have not heard from [place].  [phone]  Mom states pt plays ball and needs this medication; acyclovir; would like it sent to [facility], [address], [place], [address]  Pt aware [facility] not in today but is in tomorrow; can try sending it to another provider if she'd like.  She wants it sent to another provider and if they deny it to send to [facility] tomorrow.  Pharm changed to the one in [place].

## 2021-01-26 ENCOUNTER — Other Ambulatory Visit: Payer: Self-pay

## 2021-01-26 MED ORDER — LO LOESTRIN FE 1 MG-10 MCG / 10 MCG PO TABS: 1.0000 | ORAL_TABLET | Freq: Every day | ORAL | 0 refills | Status: DC

## 2021-01-26 NOTE — Telephone Encounter (Signed)
Pt calling for refill of bc; appt sched for 02/13/21.  [phone]  Pt aware bc eRx'd.

## 2021-02-13 ENCOUNTER — Other Ambulatory Visit: Payer: Self-pay

## 2021-02-13 ENCOUNTER — Other Ambulatory Visit (HOSPITAL_COMMUNITY)
Admission: RE | Admit: 2021-02-13 | Discharge: 2021-02-13 | Disposition: A | Discharge status: Home / Self Care | Payer: Medicaid Other | Source: Ambulatory Visit | Attending: Obstetrics and Gynecology | Admitting: Obstetrics and Gynecology

## 2021-02-13 ENCOUNTER — Ambulatory Visit (INDEPENDENT_AMBULATORY_CARE_PROVIDER_SITE_OTHER): Payer: Medicaid Other | Admitting: Obstetrics and Gynecology

## 2021-02-13 ENCOUNTER — Encounter: Payer: Self-pay | Admitting: Obstetrics and Gynecology

## 2021-02-13 VITALS — BP 110/58 | HR 63 | Ht 65.0 in | Wt 118.0 lb

## 2021-02-13 DIAGNOSIS — Z113 Encounter for screening for infections with a predominantly sexual mode of transmission: Secondary | ICD-10-CM | POA: Insufficient documentation

## 2021-02-13 DIAGNOSIS — Z01419 Encounter for gynecological examination (general) (routine) without abnormal findings: Secondary | ICD-10-CM

## 2021-02-13 DIAGNOSIS — Z124 Encounter for screening for malignant neoplasm of cervix: Secondary | ICD-10-CM

## 2021-02-13 MED ORDER — LO LOESTRIN FE 1 MG-10 MCG / 10 MCG PO TABS: 1.0000 | ORAL_TABLET | Freq: Every day | ORAL | 3 refills | Status: DC

## 2021-02-13 NOTE — Progress Notes (Signed)
Gynecology Annual Exam  PCP: Smitty Cords, DO  Chief Complaint: No chief complaint on file.   History of Present Illness: Patient is a 20 y.o. G0P0000 presents for annual exam. The patient has no complaints today.   LMP: No LMP recorded. Occasional irregular cycles on Lo Loestrin Fe.  She currently uses OCP (estrogen/progesterone) for contraception. She denies dyspareunia.  The patient does perform self breast exams.  There is no notable family history of breast or ovarian cancer in her family.  The patient wears seatbelts: yes.  The patient has regular exercise: not asked.    The patient denies current symptoms of depression.    Review of Systems: Review of Systems  Constitutional:  Negative for chills and fever.  HENT:  Negative for congestion.   Respiratory:  Negative for cough and shortness of breath.   Cardiovascular:  Negative for chest pain and palpitations.  Gastrointestinal:  Negative for abdominal pain, constipation, diarrhea, heartburn, nausea and vomiting.  Genitourinary:  Negative for dysuria, frequency and urgency.  Skin:  Negative for itching and rash.  Neurological:  Negative for dizziness and headaches.  Endo/Heme/Allergies:  Negative for polydipsia.  Psychiatric/Behavioral:  Negative for depression.    Past Medical History:  Patient Active Problem List   Diagnosis Date Noted   Routine medical exam 04/07/2020   Weight loss 04/07/2020   Mild exercise-induced asthma 03/26/2019   Moderate ankle sprain, left, initial encounter 09/14/2018   Pain 09/14/2018    Past Surgical History:  Past Surgical History:  Procedure Laterality Date   WISDOM TOOTH EXTRACTION  2018   WISDOM TOOTH EXTRACTION  2019    Gynecologic History:  No LMP recorded. Contraception: OCP (estrogen/progesterone)  Obstetric History: G0P0000  Family History:  No family history on file.  Social History:  Social History   Socioeconomic History   Marital status:  Single    Spouse name: Not on file   Number of children: Not on file   Years of education: Not on file   Highest education level: Not on file  Occupational History   Not on file  Tobacco Use   Smoking status: Never   Smokeless tobacco: Never  Vaping Use   Vaping Use: Never used  Substance and Sexual Activity   Alcohol use: No   Drug use: No   Sexual activity: Never    Birth control/protection: Pill    Comment: Never sexually active  Other Topics Concern   Not on file  Social History Narrative   Not on file   Social Determinants of Health   Financial Resource Strain: Not on file  Food Insecurity: Not on file  Transportation Needs: Not on file  Physical Activity: Not on file  Stress: Not on file  Social Connections: Not on file  Intimate Partner Violence: Not on file    Allergies:  No Known Allergies  Medications: Prior to Admission medications   Medication Sig Start Date End Date Taking? Authorizing Provider  albuterol (VENTOLIN HFA) 108 (90 Base) MCG/ACT inhaler Inhale 1-2 puffs into the lungs every 4 (four) hours as needed for wheezing or shortness of breath. 04/07/20   Malfi, Jodelle Gross, FNP  ibuprofen (ADVIL) 600 MG tablet Take 1 tablet (600 mg total) by mouth every 6 (six) hours as needed. 11/10/19   Nita Sickle, MD  Norethindrone-Ethinyl Estradiol-Fe Biphas (LO LOESTRIN FE) 1 MG-10 MCG / 10 MCG tablet Take 1 tablet by mouth daily. 01/26/21   Vena Austria, MD  triamcinolone cream (KENALOG)  0.1 % Apply 1 application topically 2 (two) times daily as needed. For 1-2 weeks only on affected areas 07/04/19   Smitty Cords, DO    Physical Exam Vitals: Blood pressure (!) 110/58, pulse 63, height 5\' 5"  (1.651 m), weight 118 lb (53.5 kg).   General: NAD HEENT: normocephalic, anicteric Thyroid: no enlargement, no palpable nodules Pulmonary: No increased work of breathing, CTAB Cardiovascular: RRR, distal pulses 2+ Breast: Breast symmetrical, no  tenderness, no palpable nodules or masses, no skin or nipple retraction present, no nipple discharge.  No axillary or supraclavicular lymphadenopathy. Abdomen: NABS, soft, non-tender, non-distended.  Umbilicus without lesions.  No hepatomegaly, splenomegaly or masses palpable. No evidence of hernia  Genitourinary:  External: Normal external female genitalia.  Normal urethral meatus, normal Bartholin's and Skene's glands.    Vagina: Normal vaginal mucosa, no evidence of prolapse.    Cervix: Grossly normal in appearance, no bleeding  Uterus: Non-enlarged, mobile, normal contour.  No CMT  Adnexa: ovaries non-enlarged, no adnexal masses  Rectal: deferred  Lymphatic: no evidence of inguinal lymphadenopathy Extremities: no edema, erythema, or tenderness Neurologic: Grossly intact Psychiatric: mood appropriate, affect full  Female chaperone present for pelvic and breast  portions of the physical exam  Immunization History  Administered Date(s) Administered   Tdap 04/27/2011     Assessment: 20 y.o. G0P0000 routine annual exam  Plan: Problem List Items Addressed This Visit   None Visit Diagnoses     Encounter for gynecological examination without abnormal finding    -  Primary   Screening for malignant neoplasm of cervix       Relevant Orders   Cytology - PAP   Routine screening for STI (sexually transmitted infection)       Relevant Orders   Cytology - PAP      1) STI screening  wasoffered and declined  3)  ASCCP guidelines and rational discussed.  Patient opts for every 3 years screening interval  4) Contraception - the patient is currently using  OCP (estrogen/progesterone).  She is happy with her current form of contraception and plans to continue We discussed safe sex practices to reduce her furture risk of STI's.    5) Return in about 1 year (around 02/13/2022) for annual.   04/15/2022, MD, Vena Austria OB/GYN, [facility] 02/13/2021, 11:23 AM

## 2021-02-18 LAB — CYTOLOGY - PAP
Adequacy: ABSENT
Chlamydia: NEGATIVE
Comment: NEGATIVE
Comment: NEGATIVE
Comment: NORMAL
High risk HPV: NEGATIVE
Neisseria Gonorrhea: NEGATIVE

## 2021-04-13 ENCOUNTER — Other Ambulatory Visit: Payer: Self-pay | Admitting: Obstetrics and Gynecology

## 2021-04-13 MED ORDER — FLUCONAZOLE 150 MG PO TABS: 150.0000 mg | ORAL_TABLET | Freq: Once | ORAL | 0 refills | Status: AC

## 2021-04-15 ENCOUNTER — Ambulatory Visit (INDEPENDENT_AMBULATORY_CARE_PROVIDER_SITE_OTHER): Payer: Medicaid Other | Admitting: Family Medicine

## 2021-04-15 ENCOUNTER — Other Ambulatory Visit: Payer: Self-pay

## 2021-04-15 ENCOUNTER — Encounter: Payer: Self-pay | Admitting: Family Medicine

## 2021-04-15 VITALS — BP 96/69 | HR 61 | Ht 65.0 in | Wt 116.2 lb

## 2021-04-15 DIAGNOSIS — J45990 Exercise induced bronchospasm: Secondary | ICD-10-CM

## 2021-04-15 DIAGNOSIS — Z025 Encounter for examination for participation in sport: Secondary | ICD-10-CM

## 2021-04-15 MED ORDER — ALBUTEROL SULFATE HFA 108 (90 BASE) MCG/ACT IN AERS: 1.0000 | INHALATION_SPRAY | RESPIRATORY_TRACT | 2 refills | Status: AC | PRN

## 2021-04-15 NOTE — Patient Instructions (Addendum)
Thank you for coming to the office today.  Cleared to participate Refilled albuterol inhaler   Please schedule a Follow-up Appointment to: Return in about 1 year (around 04/15/2022) for Yearly physical / sports physical.  If you have any other questions or concerns, please feel free to call the office or send a message through MyChart. You may also schedule an earlier appointment if necessary.  Additionally, you may be receiving a survey about your experience at our office within a few days to 1 week by e-mail or mail. We value your feedback.  Saralyn Pilar, DO [facility], [place]

## 2021-04-15 NOTE — Progress Notes (Signed)
Subjective:    Patient ID: Barbara Barnes, female    DOB: 2000/10/09, 20 y.o.   MRN: [id number]  Barbara Barnes is a 20 y.o. female presenting on 04/15/2021 for Annual Exam   HPI  SPORTS PHYSICAL - Patient is currently a Chief Strategy Officer at [employer], and plans to resume participation on the  [employer] for school, practice starts October 2022. They have requested Sports Physical and document completed prior to starting practice. - All standard sports physical questions completed per provided handout, only positive question answered yes was problem with breathing during exercise with known history of Asthma, mostly exercise induced but it is infrequent by report < 1 x month flare, usually mild and self resolved with use of albuterol PRN.   No known prior history of concussion, head trauma, significant known joint problem or fracture, surgery, chest pain, syncope related to exertion, family history of sudden cardiac death, seizures, among other questions (all negative)   Lifestyle Balanced diet Regular exercise, plays basketball Will be sophomore at [employer] this Fall 2020 Doing well in school She feels safe in relationships at home and school Denies any exposure to drugs tobacco alcohol  On OCP for birth control, followed by [facility] GYN   Health Maintenance: Recent updated Pap Smear per GYN UTD on Vaccines Due for Flu Shot.  Depression screen [facility] 2/9 04/15/2021 07/04/2019 03/26/2019  Decreased Interest 0 0 0  Down, Depressed, Hopeless 0 0 0  PHQ - 2 Score 0 0 0  Altered sleeping 0 - -  Tired, decreased energy 0 - -  Change in appetite 0 - -  Feeling bad or failure about yourself  0 - -  Trouble concentrating 0 - -  Moving slowly or fidgety/restless 0 - -  Suicidal thoughts 0 - -  PHQ-9 Score 0 - -  Difficult doing work/chores Not difficult at all - -    Past Medical History:  Diagnosis Date   Asthma    Past Surgical History:  Procedure Laterality Date   WISDOM  TOOTH EXTRACTION  2018   WISDOM TOOTH EXTRACTION  2019   Social History   Socioeconomic History   Marital status: Single    Spouse name: Not on file   Number of children: Not on file   Years of education: Not on file   Highest education level: Not on file  Occupational History   Not on file  Tobacco Use   Smoking status: Never   Smokeless tobacco: Never  Vaping Use   Vaping Use: Never used  Substance and Sexual Activity   Alcohol use: Never   Drug use: Never   Sexual activity: Yes    Birth control/protection: Pill    Comment: Never sexually active  Other Topics Concern   Not on file  Social History Narrative   Not on file   Social Determinants of Health   Financial Resource Strain: Not on file  Food Insecurity: Not on file  Transportation Needs: Not on file  Physical Activity: Not on file  Stress: Not on file  Social Connections: Not on file  Intimate Partner Violence: Not on file   Family History  Problem Relation Age of Onset   Rheum arthritis Neg Hx    Current Outpatient Medications on File Prior to Visit  Medication Sig   Norethindrone-Ethinyl Estradiol-Fe Biphas (LO LOESTRIN FE) 1 MG-10 MCG / 10 MCG tablet Take 1 tablet by mouth daily.   triamcinolone cream (KENALOG) 0.1 % Apply 1 application topically  2 (two) times daily as needed. For 1-2 weeks only on affected areas (Patient not taking: No sig reported)   No current facility-administered medications on file prior to visit.    Review of Systems  Constitutional:  Negative for activity change, appetite change, chills, diaphoresis, fatigue and fever.  HENT:  Negative for congestion and hearing loss.   Eyes:  Negative for visual disturbance.  Respiratory:  Negative for cough, chest tightness, shortness of breath and wheezing.   Cardiovascular:  Negative for chest pain, palpitations and leg swelling.  Gastrointestinal:  Negative for abdominal pain, constipation, diarrhea, nausea and vomiting.   Genitourinary:  Negative for dysuria, frequency and hematuria.  Musculoskeletal:  Negative for arthralgias and neck pain.  Skin:  Negative for rash.  Neurological:  Negative for dizziness, weakness, light-headedness, numbness and headaches.  Hematological:  Negative for adenopathy.  Psychiatric/Behavioral:  Negative for behavioral problems, dysphoric mood and sleep disturbance.   Per HPI unless specifically indicated above     Objective:    BP 96/69   Pulse 61   Ht 5\' 5"  (1.651 m)   Wt 116 lb 3.2 oz (52.7 kg)   SpO2 100%   BMI 19.34 kg/m   Wt Readings from Last 3 Encounters:  04/15/21 116 lb 3.2 oz (52.7 kg)  02/13/21 118 lb (53.5 kg)  04/07/20 114 lb (51.7 kg)    Physical Exam Vitals and nursing note reviewed.  Constitutional:      General: She is not in acute distress.    Appearance: She is well-developed. She is not diaphoretic.     Comments: Well-appearing, comfortable, cooperative  HENT:     Head: Normocephalic and atraumatic.  Eyes:     General:        Right eye: No discharge.        Left eye: No discharge.     Conjunctiva/sclera: Conjunctivae normal.     Pupils: Pupils are equal, round, and reactive to light.  Neck:     Thyroid: No thyromegaly.  Cardiovascular:     Rate and Rhythm: Normal rate and regular rhythm.     Pulses: Normal pulses.     Heart sounds: Normal heart sounds. No murmur heard. Pulmonary:     Effort: Pulmonary effort is normal. No respiratory distress.     Breath sounds: Normal breath sounds. No wheezing or rales.  Abdominal:     General: Bowel sounds are normal. There is no distension.     Palpations: Abdomen is soft. There is no mass.     Tenderness: There is no abdominal tenderness.  Musculoskeletal:        General: No tenderness. Normal range of motion.     Cervical back: Normal range of motion and neck supple.     Comments: Upper / Lower Extremities: - Normal muscle tone, strength bilateral upper extremities 5/5, lower extremities  5/5  Lymphadenopathy:     Cervical: No cervical adenopathy.  Skin:    General: Skin is warm and dry.     Findings: No erythema or rash.  Neurological:     Mental Status: She is alert and oriented to person, place, and time.     Comments: Distal sensation intact to light touch all extremities  Psychiatric:        Mood and Affect: Mood normal.        Behavior: Behavior normal.        Thought Content: Thought content normal.     Comments: Well groomed, good eye contact, normal speech  and thoughts   Results for orders placed or performed in visit on 02/13/21  Cytology - PAP  Result Value Ref Range   High risk HPV Negative    Neisseria Gonorrhea Negative    Chlamydia Negative    Adequacy      Satisfactory for evaluation; transformation zone component ABSENT.   Diagnosis - Low grade squamous intraepithelial lesion (LSIL) (A)    Microorganisms      Fungal organisms present consistent with Candida spp.   Comment Normal Reference Range HPV - Negative    Comment Normal Reference Ranger Chlamydia - Negative    Comment      Normal Reference Range Neisseria Gonorrhea - Negative      Assessment & Plan:   Problem List Items Addressed This Visit     Mild exercise-induced asthma   Relevant Medications   albuterol (VENTOLIN HFA) 108 (90 Base) MCG/ACT inhaler   Other Visit Diagnoses     Routine sports physical exam    -  Primary       Cleared to participate in Basketball now at school and additional sports within 1 year.  Mild exercise induced asthma - controlled Has inhaler albuterol PRN infrequent use Should not limit participation  Completed form, signed, returned original to patient and we scanned copy.  Encouraged healthy balanced diet Reviewed routine preventative topics / vaccines    Meds ordered this encounter  Medications   albuterol (VENTOLIN HFA) 108 (90 Base) MCG/ACT inhaler    Sig: Inhale 1-2 puffs into the lungs every 4 (four) hours as needed for wheezing or  shortness of breath.    Dispense:  6.7 g    Refill:  2      Follow up plan: Return in about 1 year (around 04/15/2022) for Yearly physical / sports physical.  Saralyn Pilar, DO [facility] 04/15/2021, 9:03 AM

## 2021-07-18 ENCOUNTER — Other Ambulatory Visit: Payer: Self-pay | Admitting: Obstetrics and Gynecology

## 2021-07-18 DIAGNOSIS — B009 Herpesviral infection, unspecified: Secondary | ICD-10-CM

## 2021-07-18 MED ORDER — VALACYCLOVIR HCL 1 G PO TABS: 1000.0000 mg | ORAL_TABLET | Freq: Every day | ORAL | 6 refills | Status: AC

## 2021-07-18 NOTE — Progress Notes (Signed)
Valtrex rx sent

## 2021-10-09 ENCOUNTER — Telehealth: Payer: Self-pay | Admitting: Family Medicine

## 2021-10-09 ENCOUNTER — Encounter: Payer: Self-pay | Admitting: Family Medicine

## 2021-10-09 NOTE — Telephone Encounter (Signed)
Patient needs to know blood type or how to find it on my chart. Please call back

## 2021-10-09 NOTE — Telephone Encounter (Signed)
Called pt, no answer. We do not have a way to check that in Epic. If she has donated or received blood, she would need to find out that way.

## 2021-11-12 DIAGNOSIS — Z3041 Encounter for surveillance of contraceptive pills: Secondary | ICD-10-CM

## 2022-01-25 MED ORDER — LO LOESTRIN FE 1 MG-10 MCG / 10 MCG PO TABS: 1.0000 | ORAL_TABLET | Freq: Every day | ORAL | 0 refills | Status: DC

## 2022-01-25 NOTE — Telephone Encounter (Signed)
Refill sent. Patient aware.  

## 2022-01-25 NOTE — Telephone Encounter (Signed)
Spoke with patient. Advised I do not see any refill request in her chart from the pharmacy. Dr Bonney Aid is no longer with our practice. It's possible it went to him at his new practice. Advised she is due for annual on/after 02/25/22. Advised we can send one refill to get her to her annual once she schedules appointment. Unable to reach front desk staff to schedule. Patient aware someone will contact her to schedule Annual.

## 2022-01-25 NOTE — Telephone Encounter (Signed)
Pt is scheduled with LMD on 6/23.

## 2022-01-25 NOTE — Telephone Encounter (Signed)
Triage Voicemail: Patient reports her pharmacy requested a refill of her Lo LoEstrin. She hasn't heard anything and has been without her medicine for 3 days. [phone]

## 2022-02-26 ENCOUNTER — Ambulatory Visit: Payer: Medicaid Other | Admitting: Licensed Practical Nurse

## 2022-03-05 ENCOUNTER — Other Ambulatory Visit (HOSPITAL_COMMUNITY)
Admission: RE | Admit: 2022-03-05 | Discharge: 2022-03-05 | Disposition: A | Discharge status: Home / Self Care | Payer: Medicaid Other | Source: Ambulatory Visit | Attending: Licensed Practical Nurse | Admitting: Licensed Practical Nurse

## 2022-03-05 ENCOUNTER — Ambulatory Visit (INDEPENDENT_AMBULATORY_CARE_PROVIDER_SITE_OTHER): Payer: Medicaid Other | Admitting: Family Medicine

## 2022-03-05 ENCOUNTER — Encounter: Payer: Self-pay | Admitting: Family Medicine

## 2022-03-05 VITALS — BP 90/60 | Ht 64.0 in | Wt 124.0 lb

## 2022-03-05 DIAGNOSIS — Z01419 Encounter for gynecological examination (general) (routine) without abnormal findings: Secondary | ICD-10-CM | POA: Diagnosis not present

## 2022-03-05 DIAGNOSIS — Z3041 Encounter for surveillance of contraceptive pills: Secondary | ICD-10-CM

## 2022-03-05 DIAGNOSIS — R87612 Low grade squamous intraepithelial lesion on cytologic smear of cervix (LGSIL): Secondary | ICD-10-CM | POA: Diagnosis not present

## 2022-03-05 MED ORDER — LO LOESTRIN FE 1 MG-10 MCG / 10 MCG PO TABS: 1.0000 | ORAL_TABLET | Freq: Every day | ORAL | 4 refills | Status: DC

## 2022-03-05 NOTE — Progress Notes (Signed)
GYNECOLOGY ANNUAL PREVENTATIVE CARE ENCOUNTER NOTE  Subjective:   Barbara Barnes is a 21 y.o. G0P0000 female here for a routine annual gynecologic exam.  Current complaints: none, feeling well. Reports no new partners. She is sexually active with her female partner. They are exclusive with each other.   She does not have trouble taking her OCP daily. Has not missed pills. Reports she does not desire a pregnancy in the next year.   Denies abnormal vaginal bleeding, discharge, pelvic pain, problems with intercourse or other gynecologic concerns.    Gynecologic History No LMP recorded. (Menstrual status: Oral contraceptives). Has monthly cycle.  Contraception: OCP (estrogen/progesterone) Last Pap: LSIL in 2022. Last mammogram: NA.   Health Maintenance Due  Topic Date Due   HPV VACCINES (1 - 2-dose series) Never done   Hepatitis C Screening  Never done   TETANUS/TDAP  04/26/2021   PAP SMEAR-Modifier  02/13/2022    The following portions of the patient's history were reviewed and updated as appropriate: allergies, current medications, past family history, past medical history, past social history, past surgical history and problem list.  Review of Systems Pertinent items are noted in HPI.   Objective:  BP 90/60   Ht 5\' 4"  (1.626 m)   Wt 124 lb (56.2 kg)   BMI 21.28 kg/m  CONSTITUTIONAL: Well-developed, well-nourished female in no acute distress.  HENT:  Normocephalic, atraumatic, External right and left ear normal. Oropharynx is clear and moist EYES:  No scleral icterus.  NECK: Normal range of motion, supple, no masses.  Normal thyroid.  SKIN: Skin is warm and dry. No rash noted. Not diaphoretic. No erythema. No pallor. NEUROLOGIC: Alert and oriented to person, place, and time. Normal reflexes, muscle tone coordination. No cranial nerve deficit noted. PSYCHIATRIC: Normal mood and affect. Normal behavior. Normal judgment and thought content. CARDIOVASCULAR: Normal heart rate noted,  regular rhythm. 2+ distal pulses. RESPIRATORY: Effort and breath sounds normal, no problems with respiration noted. BREASTS: patient declined ABDOMEN: Soft,  no distention noted.  No tenderness, rebound or guarding.  PELVIC: Normal appearing external genitalia; normal appearing vaginal mucosa and cervix.  No abnormal discharge noted.  Pap smear obtained.  Normal uterine size, no other palpable masses, no uterine or adnexal tenderness. MUSCULOSKELETAL: Normal range of motion.    Assessment and Plan:  1) Annual gynecologic examination with pap smear:  Will follow up results of pap smear and manage accordingly. Patient declined STI screen also ordered today.  Routine preventative health maintenance measures emphasized.  2) Contraception counseling: Reviewed all forms of birth control options available including abstinence; over the counter/barrier methods; hormonal contraceptive medication including pill, patch, ring, injection,contraceptive implant; hormonal and nonhormonal IUDs; permanent sterilization options including vasectomy and the various tubal sterilization modalities. Risks and benefits reviewed.  Questions were answered.  Written information was also given to the patient to review.  Patient desires OCP, this was prescribed for patient. She will follow up in  1y for surveillance.  She was told to call with any further questions, or with any concerns about this method of contraception.  Emphasized use of condoms 100% of the time for STI prevention.  1. Well woman exam with routine gynecological exam Patient declined breast exam Patient declined STI screening and was counseled about recommend for annual testing for women under 26. Reports no changes in family history - Cytology - PAP  2. LGSIL on Pap smear of cervix - Cytology - PAP  3. Contraceptive Management, surv OCP/Pill - Renewed R  for OCP for 1 year  Please refer to After Visit Summary for other counseling recommendations.    Return in about 1 year (around 03/06/2023) for Yearly wellness exam.  Federico Flake, MD, MPH, ABFM Attending Physician Center for [facility]

## 2022-03-06 DIAGNOSIS — J019 Acute sinusitis, unspecified: Secondary | ICD-10-CM | POA: Diagnosis not present

## 2022-03-11 LAB — CYTOLOGY - PAP
Comment: NEGATIVE
Diagnosis: UNDETERMINED — AB
High risk HPV: NEGATIVE

## 2022-04-05 ENCOUNTER — Encounter: Payer: Self-pay | Admitting: Family Medicine

## 2022-04-06 MED ORDER — VALACYCLOVIR HCL 500 MG PO TABS: 500.0000 mg | ORAL_TABLET | Freq: Every day | ORAL | 11 refills | Status: DC

## 2022-04-13 ENCOUNTER — Ambulatory Visit (INDEPENDENT_AMBULATORY_CARE_PROVIDER_SITE_OTHER): Payer: Medicaid Other | Admitting: Family Medicine

## 2022-04-13 ENCOUNTER — Encounter: Payer: Self-pay | Admitting: Family Medicine

## 2022-04-13 VITALS — BP 109/62 | HR 64 | Ht 64.0 in | Wt 127.0 lb

## 2022-04-13 DIAGNOSIS — Z025 Encounter for examination for participation in sport: Secondary | ICD-10-CM | POA: Diagnosis not present

## 2022-04-13 DIAGNOSIS — J45990 Exercise induced bronchospasm: Secondary | ICD-10-CM

## 2022-04-13 DIAGNOSIS — J3089 Other allergic rhinitis: Secondary | ICD-10-CM | POA: Diagnosis not present

## 2022-04-13 MED ORDER — LORATADINE 10 MG PO TABS: 10.0000 mg | ORAL_TABLET | Freq: Every day | ORAL | 11 refills | Status: AC

## 2022-04-13 NOTE — Patient Instructions (Addendum)
Thank you for coming to the office today.  Keep up the great work  Cleared for basketball  Please schedule a Follow-up Appointment to: Return in about 1 year (around 04/14/2023) for 1 year .  If you have any other questions or concerns, please feel free to call the office or send a message through MyChart. You may also schedule an earlier appointment if necessary.  Additionally, you may be receiving a survey about your experience at our office within a few days to 1 week by e-mail or mail. We value your feedback.  Saralyn Pilar, DO [facility], [place]

## 2022-04-13 NOTE — Progress Notes (Signed)
Subjective:    Patient ID: Barbara Barnes, female    DOB: Oct 03, 2000, 21 y.o.   MRN: [id number]  Barbara Barnes is a 21 y.o. 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 They have requested Sports Physical and document completed prior to starting practice. - All standard sports physical questions completed per provided handout, only positive questions  answered yes were problem with breathing during exercise with known history of Asthma, mostly exercise induced but it is infrequent by report < 1 x month flare, usually mild and self resolved with use of albuterol PRN and also with allergies with dust and environmental, she takes nasal spray and anti histamine, no significant concern or limitation to her participation.   No known prior history of concussion, head trauma, significant known joint problem or fracture, surgery, chest pain, syncope related to exertion, family history of sudden cardiac death, seizures, among other questions (all negative)   Lifestyle Balanced diet Regular exercise, plays basketball Doing well in school. Now in grad school studying teaching She feels safe in relationships at home and school Denies any exposure to drugs tobacco alcohol   On OCP for birth control, followed by [facility] GYN   Health Maintenance: Recent updated Pap Smear per GYN UTD on Vaccines Due for Flu Shot.     04/13/2022   10:34 AM 04/15/2021    8:41 AM 07/04/2019   11:34 AM  Depression screen PHQ 2/9  Decreased Interest 0 0 0  Down, Depressed, Hopeless 0 0 0  PHQ - 2 Score 0 0 0  Altered sleeping 0 0   Tired, decreased energy 0 0   Change in appetite 0 0   Feeling bad or failure about yourself  0 0   Trouble concentrating 0 0   Moving slowly or fidgety/restless 0 0   Suicidal thoughts 0  0   PHQ-9 Score 0 0   Difficult doing work/chores Not difficult at all Not difficult at all     Past Medical History:  Diagnosis Date   Asthma    Past Surgical History:  Procedure Laterality Date   WISDOM TOOTH EXTRACTION  2018   WISDOM TOOTH EXTRACTION  2019   Social History   Socioeconomic History   Marital status: Single    Spouse name: Not on file   Number of children: Not on file   Years of education: Not on file   Highest education level: Not on file  Occupational History   Not on file  Tobacco Use   Smoking status: Never   Smokeless tobacco: Never  Vaping Use   Vaping Use: Never used  Substance and Sexual Activity   Alcohol use: Yes   Drug use: Never   Sexual activity: Yes    Birth control/protection: Pill, Condom  Other Topics Concern   Not on file  Social History Narrative   Not on file   Social Determinants of Health   Financial Resource Strain: Not on file  Food Insecurity: Not on file  Transportation Needs: Not on file  Physical Activity: Sufficiently Active (08/02/2018)   Exercise Vital Sign    Days of Exercise per Week: 7 days    Minutes of Exercise per Session: 120 min  Stress: No Stress Concern Present (08/02/2018)   [employer] of Occupational Health - Occupational Stress Questionnaire  Feeling of Stress : Not at all  Social Connections: Unknown (08/02/2018)   Social Connection and Isolation Panel [NHANES]    Frequency of Communication with Friends and Family: More than three times a week    Frequency of Social Gatherings with Friends and Family: More than three times a week    Attends Religious Services: More than 4 times per year    Active Member of [employer] or Organizations: Yes    Attends Engineer, structural: More than 4 times per year    Marital Status: Not on file  Intimate Partner Violence: Not At Risk (08/02/2018)   Humiliation, Afraid, Rape, and Kick questionnaire    Fear of Current or Ex-Partner: No    Emotionally  Abused: No    Physically Abused: No    Sexually Abused: No   Family History  Problem Relation Age of Onset   Rheum arthritis Neg Hx    Current Outpatient Medications on File Prior to Visit  Medication Sig   albuterol (VENTOLIN HFA) 108 (90 Base) MCG/ACT inhaler Inhale 1-2 puffs into the lungs every 4 (four) hours as needed for wheezing or shortness of breath.   chlorhexidine (PERIDEX) 0.12 % solution SMARTSIG:By Mouth   Norethindrone-Ethinyl Estradiol-Fe Biphas (LO LOESTRIN FE) 1 MG-10 MCG / 10 MCG tablet Take 1 tablet by mouth daily.   valACYclovir (VALTREX) 500 MG tablet Take 1 tablet (500 mg total) by mouth daily.   No current facility-administered medications on file prior to visit.    Review of Systems  Constitutional:  Negative for activity change, appetite change, chills, diaphoresis, fatigue and fever.  HENT:  Negative for congestion and hearing loss.   Eyes:  Negative for visual disturbance.  Respiratory:  Negative for cough, chest tightness, shortness of breath and wheezing.   Cardiovascular:  Negative for chest pain, palpitations and leg swelling.  Gastrointestinal:  Negative for abdominal pain, constipation, diarrhea, nausea and vomiting.  Genitourinary:  Negative for dysuria, frequency and hematuria.  Musculoskeletal:  Negative for arthralgias and neck pain.  Skin:  Negative for rash.  Neurological:  Negative for dizziness, weakness, light-headedness, numbness and headaches.  Hematological:  Negative for adenopathy.  Psychiatric/Behavioral:  Negative for behavioral problems, dysphoric mood and sleep disturbance.    Per HPI unless specifically indicated above      Objective:    BP 109/62   Pulse 64   Ht 5\' 4"  (1.626 m)   Wt 127 lb (57.6 kg)   SpO2 100%   BMI 21.80 kg/m   Wt Readings from Last 3 Encounters:  04/13/22 127 lb (57.6 kg)  03/05/22 124 lb (56.2 kg)  04/15/21 116 lb 3.2 oz (52.7 kg)    Physical Exam Vitals and nursing note reviewed.   Constitutional:      General: She is not in acute distress.    Appearance: She is well-developed. She is not diaphoretic.     Comments: Well-appearing, comfortable, cooperative  HENT:     Head: Normocephalic and atraumatic.  Eyes:     General:        Right eye: No discharge.        Left eye: No discharge.     Conjunctiva/sclera: Conjunctivae normal.     Pupils: Pupils are equal, round, and reactive to light.  Neck:     Thyroid: No thyromegaly.  Cardiovascular:     Rate and Rhythm: Normal rate and regular rhythm.     Pulses: Normal pulses.     Heart sounds: Normal heart sounds.  No murmur heard. Pulmonary:     Effort: Pulmonary effort is normal. No respiratory distress.     Breath sounds: Normal breath sounds. No wheezing or rales.  Abdominal:     General: Bowel sounds are normal. There is no distension.     Palpations: Abdomen is soft. There is no mass.     Tenderness: There is no abdominal tenderness.  Musculoskeletal:        General: No tenderness. Normal range of motion.     Cervical back: Normal range of motion and neck supple.     Right lower leg: No edema.     Left lower leg: No edema.     Comments: Upper / Lower Extremities: - Normal muscle tone, strength bilateral upper extremities 5/5, lower extremities 5/5  Lymphadenopathy:     Cervical: No cervical adenopathy.  Skin:    General: Skin is warm and dry.     Findings: No erythema or rash.  Neurological:     Mental Status: She is alert and oriented to person, place, and time.     Comments: Distal sensation intact to light touch all extremities  Psychiatric:        Mood and Affect: Mood normal.        Behavior: Behavior normal.        Thought Content: Thought content normal.     Comments: Well groomed, good eye contact, normal speech and thoughts    Results for orders placed or performed in visit on 03/05/22  Cytology - PAP  Result Value Ref Range   High risk HPV Negative    Adequacy      Satisfactory for  evaluation; transformation zone component PRESENT.   Diagnosis (A)     - Atypical squamous cells of undetermined significance (ASC-US)   Comment Normal Reference Range HPV - Negative       Assessment & Plan:   Problem List Items Addressed This Visit     Mild exercise-induced asthma   Other Visit Diagnoses     Routine sports physical exam    -  Primary   Environmental and seasonal allergies       Relevant Medications   loratadine (CLARITIN) 10 MG tablet       Cleared to participate in Basketball now at school and additional sports within 1 year.  Completed form, signed, stamped, returned original to patient and we scanned copy.  Encouraged healthy balanced diet Reviewed routine prevention wellness  Environmental Allergies May use Claritin OTC Continue nasal spray  Mild exercise induced asthma No flare recently Has rescue inhaler Abuterol PRN only if need Not limiting her sports and activity participation.   Meds ordered this encounter  Medications   loratadine (CLARITIN) 10 MG tablet    Sig: Take 1 tablet (10 mg total) by mouth daily. Use for 4-6 weeks then stop, and use as needed or seasonally    Dispense:  30 tablet    Refill:  11      Follow up plan: Return in about 1 year (around 04/14/2023) for 1 year .  Saralyn Pilar, DO [facility] 04/13/2022, 10:41 AM

## 2022-05-08 DIAGNOSIS — N760 Acute vaginitis: Secondary | ICD-10-CM | POA: Diagnosis not present

## 2022-05-08 DIAGNOSIS — N898 Other specified noninflammatory disorders of vagina: Secondary | ICD-10-CM | POA: Diagnosis not present

## 2022-05-08 DIAGNOSIS — B9689 Other specified bacterial agents as the cause of diseases classified elsewhere: Secondary | ICD-10-CM | POA: Diagnosis not present

## 2023-04-04 ENCOUNTER — Other Ambulatory Visit: Payer: Self-pay | Admitting: *Deleted

## 2023-04-04 ENCOUNTER — Encounter: Payer: Self-pay | Admitting: Family Medicine

## 2023-04-04 DIAGNOSIS — Z3041 Encounter for surveillance of contraceptive pills: Secondary | ICD-10-CM

## 2023-04-04 MED ORDER — LO LOESTRIN FE 1 MG-10 MCG / 10 MCG PO TABS: 1.0000 | ORAL_TABLET | Freq: Every day | ORAL | 4 refills | Status: DC

## 2023-04-04 MED ORDER — VALACYCLOVIR HCL 500 MG PO TABS: 500.0000 mg | ORAL_TABLET | Freq: Every day | ORAL | 2 refills | Status: AC

## 2023-05-05 DIAGNOSIS — H5213 Myopia, bilateral: Secondary | ICD-10-CM | POA: Diagnosis not present

## 2023-07-12 ENCOUNTER — Encounter: Payer: Self-pay | Admitting: Family Medicine

## 2023-07-14 ENCOUNTER — Telehealth: Payer: Self-pay

## 2023-07-14 ENCOUNTER — Other Ambulatory Visit: Payer: Self-pay

## 2023-07-14 MED ORDER — VALACYCLOVIR HCL 500 MG PO TABS: 500.0000 mg | ORAL_TABLET | Freq: Every day | ORAL | 0 refills | Status: DC

## 2023-07-14 NOTE — Telephone Encounter (Signed)
Please call and schedule patient for annual exam

## 2023-07-14 NOTE — Telephone Encounter (Signed)
Contacted the patient via phone, she is confirmed for 11/20 with Autumn Messing

## 2023-07-27 ENCOUNTER — Other Ambulatory Visit (HOSPITAL_COMMUNITY)
Admission: RE | Admit: 2023-07-27 | Discharge: 2023-07-27 | Disposition: A | Discharge status: Home / Self Care | Payer: Medicaid Other | Source: Ambulatory Visit

## 2023-07-27 ENCOUNTER — Ambulatory Visit: Payer: Medicaid Other

## 2023-07-27 VITALS — BP 117/70 | HR 70 | Ht 64.0 in | Wt 124.0 lb

## 2023-07-27 DIAGNOSIS — A5901 Trichomonal vulvovaginitis: Secondary | ICD-10-CM | POA: Insufficient documentation

## 2023-07-27 DIAGNOSIS — Z01419 Encounter for gynecological examination (general) (routine) without abnormal findings: Secondary | ICD-10-CM | POA: Diagnosis not present

## 2023-07-27 DIAGNOSIS — Z124 Encounter for screening for malignant neoplasm of cervix: Secondary | ICD-10-CM | POA: Diagnosis not present

## 2023-07-27 DIAGNOSIS — Z0000 Encounter for general adult medical examination without abnormal findings: Secondary | ICD-10-CM

## 2023-07-27 DIAGNOSIS — A549 Gonococcal infection, unspecified: Secondary | ICD-10-CM

## 2023-07-27 DIAGNOSIS — A5602 Chlamydial vulvovaginitis: Secondary | ICD-10-CM | POA: Diagnosis not present

## 2023-07-27 DIAGNOSIS — A749 Chlamydial infection, unspecified: Secondary | ICD-10-CM

## 2023-07-27 DIAGNOSIS — A5402 Gonococcal vulvovaginitis, unspecified: Secondary | ICD-10-CM | POA: Insufficient documentation

## 2023-07-27 DIAGNOSIS — Z3041 Encounter for surveillance of contraceptive pills: Secondary | ICD-10-CM

## 2023-07-27 MED ORDER — VALACYCLOVIR HCL 500 MG PO TABS: 500.0000 mg | ORAL_TABLET | Freq: Every day | ORAL | 0 refills | Status: AC

## 2023-07-27 MED ORDER — LO LOESTRIN FE 1 MG-10 MCG / 10 MCG PO TABS: 1.0000 | ORAL_TABLET | Freq: Every day | ORAL | 4 refills | Status: DC

## 2023-07-27 NOTE — Assessment & Plan Note (Signed)
-   Reviewed health maintenance topics as documented below. - Most recent pap smear in 2023, ASCUS/HPV-, pap smear collected today.. - Satisfied with current contraception. Refill for COCs provided. - STI screening accepted for vaginal swab, declines blood work.

## 2023-07-27 NOTE — Progress Notes (Signed)
Outpatient Gynecology Note: Annual Visit  Assessment/Plan:    Barbara Barnes is a 22 y.o. female G0P0000 with normal well-woman gynecologic exam.   Well woman exam - Reviewed health maintenance topics as documented below. - Most recent pap smear in 2023, ASCUS/HPV-, pap smear collected today.. - Satisfied with current contraception. Refill for COCs provided. - STI screening accepted for vaginal swab, declines blood work.      Risk factors identified in Subjective to review: none No orders of the defined types were placed in this encounter.  Current Outpatient Medications  Medication Instructions   albuterol (VENTOLIN HFA) 108 (90 Base) MCG/ACT inhaler 1-2 puffs, Inhalation, Every 4 hours PRN   chlorhexidine (PERIDEX) 0.12 % solution SMARTSIG:By Mouth   loratadine (CLARITIN) 10 mg, Oral, Daily, Use for 4-6 weeks then stop, and use as needed or seasonally   Norethindrone-Ethinyl Estradiol-Fe Biphas (LO LOESTRIN FE) 1 MG-10 MCG / 10 MCG tablet 1 tablet, Oral, Daily   valACYclovir (VALTREX) 500 mg, Oral, Daily    No follow-ups on file.    Subjective:    Barbara Barnes is a 22 y.o. female G0P0000 who presents for annual wellness visit.   Occupation works as Engineer, technical sales.    Lives with mom.    CONCERNS? Desires STI testing, refills on Valtrex and COCs  Well Woman Visit:  GYN HISTORY:  No LMP recorded (lmp unknown). (Menstrual status: Oral contraceptives).     Menstrual History: OB History     Gravida  0   Para  0   Term  0   Preterm  0   AB  0   Living  0      SAB  0   IAB  0   Ectopic  0   Multiple  0   Live Births  0           Menarche age: [age] No LMP recorded (lmp unknown). (Menstrual status: Oral contraceptives).   Intermenstrual bleeding, spotting, or discharge? no Urinary incontinence? no  Sexually active: yes Number of sexual partners: one Gender of sexual Partners: male Dyspareunia? no Last pap: 2023, ASCUS/HPV-, 2022, LSIL/HPV-   History of abnormal Pap: see above Gardasil series:  thinks she got as a teenager STI history: no STI/HIV testing or immunizations needed? Yes.    Contraceptive methods: oral contraceptives (estrogen/progesterone)  Health Maintenance > Reviewed breast self-awareness, no family history > History of abnormal mammogram: No > Exercise:  plays basketball , very active > Dietary Supplements: no > Body mass index is 21.28 kg/m.  > Recent dental visit Yes.   > Seat Belt Use: Yes.   > Texting and driving? No. > Guns in the house No. > Concern for alcohol abuse? Just social drinking   Tobacco or other drug use: denied. Tobacco Use: Low Risk  (07/27/2023)   Patient History    Smoking Tobacco Use: Never    Smokeless Tobacco Use: Never    Passive Exposure: Not on file     PHQ-2 Score: In last two weeks, how often have you felt: Little interest or pleasure in doing things: Not at all (0) Feeling down, depressed or hopeless: Not at all (0) Score: 0  GAD-2 Over the last 2 weeks, how often have you been bothered by the following problems? Feeling nervous, anxious or on edge: Not at all (0) Not being able to stop or control worrying: Not at all (0)} Score: 0   _________________________________________________________  Current Outpatient Medications  Medication Sig Dispense Refill  albuterol (VENTOLIN HFA) 108 (90 Base) MCG/ACT inhaler Inhale 1-2 puffs into the lungs every 4 (four) hours as needed for wheezing or shortness of breath. 6.7 g 2   chlorhexidine (PERIDEX) 0.12 % solution SMARTSIG:By Mouth (Patient not taking: Reported on 07/27/2023)     loratadine (CLARITIN) 10 MG tablet Take 1 tablet (10 mg total) by mouth daily. Use for 4-6 weeks then stop, and use as needed or seasonally (Patient not taking: Reported on 07/27/2023) 30 tablet 11   Norethindrone-Ethinyl Estradiol-Fe Biphas (LO LOESTRIN FE) 1 MG-10 MCG / 10 MCG tablet Take 1 tablet by mouth daily. 84 tablet 4    valACYclovir (VALTREX) 500 MG tablet Take 1 tablet (500 mg total) by mouth daily. 30 tablet 0   No current facility-administered medications for this visit.   No Known Allergies  Past Medical History:  Diagnosis Date   Asthma    Past Surgical History:  Procedure Laterality Date   WISDOM TOOTH EXTRACTION  2018   WISDOM TOOTH EXTRACTION  2019   OB History     Gravida  0   Para  0   Term  0   Preterm  0   AB  0   Living  0      SAB  0   IAB  0   Ectopic  0   Multiple  0   Live Births  0          Social History   Tobacco Use   Smoking status: Never   Smokeless tobacco: Never  Substance Use Topics   Alcohol use: Yes   Social History   Substance and Sexual Activity  Sexual Activity Yes   Birth control/protection: Pill, Condom    Immunization History  Administered Date(s) Administered   Tdap 04/27/2011     Review Of Systems  Constitutional: Denied constitutional symptoms, night sweats, recent illness, fatigue, fever, insomnia and weight loss.  Eyes: Denied eye symptoms, eye pain, photophobia, vision change and visual disturbance.  Ears/Nose/Throat/Neck: Denied ear, nose, throat or neck symptoms, hearing loss, nasal discharge, sinus congestion and sore throat.  Cardiovascular: Denied cardiovascular symptoms, arrhythmia, chest pain/pressure, edema, exercise intolerance, orthopnea and palpitations.  Respiratory: Denied pulmonary symptoms, asthma, pleuritic pain, productive sputum, cough, dyspnea and wheezing.  Gastrointestinal: Denied, gastro-esophageal reflux, melena, nausea and vomiting.  Genitourinary: Denied genitourinary symptoms including symptomatic vaginal discharge, pelvic relaxation issues, and urinary complaints.  Musculoskeletal: Denied musculoskeletal symptoms, stiffness, swelling, muscle weakness and myalgia.  Dermatologic: Denied dermatology symptoms, rash and scar.  Neurologic: Denied neurology symptoms, dizziness, headache, neck pain  and syncope.  Psychiatric: Denied psychiatric symptoms, anxiety and depression.  Endocrine: Denied endocrine symptoms including hot flashes and night sweats.      Objective:    BP 117/70   Pulse 70   Ht 5\' 4"  (1.626 m)   Wt 124 lb (56.2 kg)   LMP  (LMP Unknown)   BMI 21.28 kg/m   Constitutional: Well-developed, well-nourished female in no acute distress Neurological: Alert and oriented to person, place, and time Psychiatric: Mood and affect appropriate Skin: No rashes or lesions Neck: Supple without masses. Trachea is midline.Thyroid is normal size without masses Lymphatics: No cervical, axillary, supraclavicular, or inguinal adenopathy noted Respiratory: Clear to auscultation bilaterally. Good air movement with normal work of breathing. Cardiovascular: Regular rate and rhythm. Extremities grossly normal, nontender with no edema; pulses regular Gastrointestinal: Soft, nontender, nondistended. No masses or hernias appreciated. No hepatosplenomegaly. No fluid wave. No rebound or guarding. Breast Exam: deferred  with shared decision making Genitourinary:         External Genitalia: Normal female genitalia    Vagina: well-rugated, no lesions.    Cervix: No lesions, normal size and consistency; no cervical motion tenderness  Perineum/Anus: No lesions Rectal: deferred    Autumn Messing, CNM  07/27/23 9:37 AM

## 2023-08-02 LAB — CYTOLOGY - PAP
Chlamydia: POSITIVE — AB
Comment: NEGATIVE
Comment: NEGATIVE
Comment: NORMAL
Diagnosis: NEGATIVE
Neisseria Gonorrhea: POSITIVE — AB
Trichomonas: POSITIVE — AB

## 2023-08-03 MED ORDER — METRONIDAZOLE 500 MG PO TABS: 500.0000 mg | ORAL_TABLET | Freq: Two times a day (BID) | ORAL | 0 refills | Status: AC

## 2023-08-03 MED ORDER — DOXYCYCLINE HYCLATE 100 MG PO CAPS: 100.0000 mg | ORAL_CAPSULE | Freq: Two times a day (BID) | ORAL | 0 refills | Status: AC

## 2023-08-03 MED ORDER — CEFTRIAXONE SODIUM 500 MG IJ SOLR
500.0000 mg | Freq: Once | INTRAMUSCULAR | Status: AC
  Administered 2023-08-08: 500 mg via INTRAMUSCULAR

## 2023-08-03 NOTE — Telephone Encounter (Signed)
Spoke with patient. Advised Cervical Cancer screening was normal/negative. STI testing was positive. Results show STI Reference Range (Negative) as that is the expected normal result, but her result was Positive/abnormal. Patient verbalizes understanding.

## 2023-08-03 NOTE — Addendum Note (Signed)
Addended by: Autumn Messing on: 08/03/2023 01:00 PM   Modules accepted: Orders

## 2023-08-08 ENCOUNTER — Ambulatory Visit: Payer: Medicaid Other

## 2023-08-08 VITALS — BP 120/79 | HR 66 | Ht 64.0 in | Wt 128.3 lb

## 2023-08-08 DIAGNOSIS — A549 Gonococcal infection, unspecified: Secondary | ICD-10-CM

## 2023-08-08 NOTE — Progress Notes (Signed)
    NURSE VISIT NOTE  Subjective:    Patient ID: Barbara Barnes, female    DOB: [birth year], 22 y.o.   MRN: [id number]  HPI  Patient is a 22 y.o. G0P0000 female who presents for Rocephin injection. Order to administer given by Autumn Messing, CNM.  Objective:    BP 120/79   Pulse 66   Ht 5\' 4"  (1.626 m)   Wt 128 lb 4.8 oz (58.2 kg)   LMP  (LMP Unknown)   BMI 22.02 kg/m   Rocephin 500mg mg given IM by: Georgiana Shore, CMA. Site: Left Upper Outer [place].     Assessment:   1. Gonorrhea      Plan:   Return  to clinic 4 weeks for TOC.   Loman Chroman, CMA

## 2023-08-09 ENCOUNTER — Telehealth: Payer: Self-pay

## 2023-08-09 NOTE — Telephone Encounter (Signed)
Pt calling to let us know she threw up after taking the rxs(both together) she was given; was told to let us know b/c it may effect the course of tx.  Pt aware [facility] is not in the office today so will send to on call provider.  Pharm is correct in chart.

## 2023-08-10 ENCOUNTER — Telehealth: Payer: Self-pay

## 2023-08-10 NOTE — Telephone Encounter (Signed)
Verified patient name and DOB. Call to patient regarding her concern over throwing up medication she had been prescribed. Patient states that she vomited about two hours after taking the second to last doses of metronidazole and doxycycline for trich and chlamydia, respectively. Was able to tolerate all other doses. Provided reassurance that she most likely received enough of the antibiotic to complete treatment. Recommended retesting in 1-3 months for confirmation of cure if she desires. Patient expressed understanding.

## 2023-08-11 ENCOUNTER — Other Ambulatory Visit: Payer: Self-pay

## 2023-08-11 DIAGNOSIS — Z0000 Encounter for general adult medical examination without abnormal findings: Secondary | ICD-10-CM

## 2023-08-26 ENCOUNTER — Ambulatory Visit: Payer: Medicaid Other

## 2023-08-26 ENCOUNTER — Other Ambulatory Visit (HOSPITAL_COMMUNITY)
Admission: RE | Admit: 2023-08-26 | Discharge: 2023-08-26 | Disposition: A | Discharge status: Home / Self Care | Payer: Medicaid Other | Source: Ambulatory Visit | Attending: Obstetrics and Gynecology | Admitting: Obstetrics and Gynecology

## 2023-08-26 VITALS — BP 110/69 | HR 67 | Wt 128.1 lb

## 2023-08-26 DIAGNOSIS — Z202 Contact with and (suspected) exposure to infections with a predominantly sexual mode of transmission: Secondary | ICD-10-CM | POA: Diagnosis not present

## 2023-08-26 NOTE — Patient Instructions (Signed)
Safe Sex Practicing safe sex means taking steps before and during sex to reduce your risk of: Getting an STI (sexually transmitted infection). Giving your partner an STI. Unwanted or unplanned pregnancy. How to practice safe sex Ways you can practice safe sex  Limit your sexual partners to only one partner who is having sex with only you. Avoid using alcohol and drugs before having sex. Alcohol and drugs can affect your judgment. Before having sex with a new partner: Talk to your partner about past partners, past STIs, and drug use. Get screened for STIs and discuss the results with your partner. Ask your partner to get screened too. Check your body regularly for sores, blisters, rashes, or unusual discharge. If you notice any of these problems, visit your health care provider. Avoid sexual contact if you have symptoms of an infection or you are being treated for an STI. While having sex, use a condom. Make sure to: Use a condom every time you have vaginal, oral, or anal sex. Both females and males should wear condoms during oral sex. Keep condoms in place from the beginning to the end of sexual activity. Use a latex condom, if possible. Latex condoms offer the best protection. Use only water-based lubricants with a condom. Using petroleum-based lubricants or oils will weaken the condom and increase the chance that it will break. Ways your health care provider can help you practice safe sex  See your health care provider for regular screenings, exams, and tests for STIs. Talk with your health care provider about what kind of birth control (contraception) is best for you. Get vaccinated against hepatitis B and human papillomavirus (HPV). If you are at risk of being infected with HIV (human immunodeficiency virus), talk with your health care provider about taking a prescription medicine to prevent HIV infection. You are at risk for HIV if you: Are a man who has sex with other men. Are  sexually active with more than one partner. Take drugs by injection. Have a sex partner who has HIV. Have unprotected sex. Have sex with someone who has sex with both men and women. Have had an STI. Follow these instructions at home: Take over-the-counter and prescription medicines only as told by your health care provider. Keep all follow-up visits. This is important. Where to find more information Centers for Disease Control and Prevention: www.cdc.gov Planned Parenthood: www.plannedparenthood.org Office on Women's Health: www.womenshealth.gov Summary Practicing safe sex means taking steps before and during sex to reduce your risk getting an STI, giving your partner an STI, and having an unwanted or unplanned pregnancy. Before having sex with a new partner, talk to your partner about past partners, past STIs, and drug use. Use a condom every time you have vaginal, oral, or anal sex. Both females and males should wear condoms during oral sex. Check your body regularly for sores, blisters, rashes, or unusual discharge. If you notice any of these problems, visit your health care provider. See your health care provider for regular screenings, exams, and tests for STIs. This information is not intended to replace advice given to you by your health care provider. Make sure you discuss any questions you have with your health care provider. Document Revised: 01/28/2020 Document Reviewed: 01/28/2020 Elsevier Patient Education  2024 [employer]vier Inc.  

## 2023-08-26 NOTE — Progress Notes (Signed)
    NURSE VISIT NOTE  Subjective:    Patient ID: Barbara Barnes, female    DOB: Oct 05, 2000, 22 y.o.   MRN: [id number]  HPI  Patient is a 22 y.o. G0P0000 female who presents for STD screening. Patient tested positive for Gonorrhea, Chlamydia and Trichomonas on 07/27/23. Patient was given a round of antibiotics and had sent mychart message on 08/23/23 with concerns of reinfection because partner did not complete his course of antibiotics and patient states that when she was on antibiotics she threw them up and was unable to tolerate. Per Autumn Messing patient is to return back to clinic today for retesting before new antibiotic is prescribed to make sure infection is cleared.  Patient reports today, odorless vaginal discharge and vaginal itching for 3 day(s) . Denies abnormal vaginal bleeding or significant pelvic pain or fever. denies dysuria, hematuria, urinary frequency, urinary urgency, flank pain, abdominal pain, pelvic pain, and cloudy malordorous urine. Patient has history of known exposure to STD.   Objective:    BP 110/69   Pulse 67   Wt 128 lb 1.6 oz (58.1 kg)   LMP  (LMP Unknown)   BMI 21.99 kg/m      Assessment:   1. Exposure to sexually transmitted disease (STD)     rule out GC or chlamydia  Plan:   GC and chlamydia DNA  probe sent to lab. Treatment: await results of cervicovaginal swab, abstain from coitus during course of treatment ROV prn if symptoms persist or worsen.   Fonda Kinder, CMA

## 2023-08-29 ENCOUNTER — Encounter: Payer: Self-pay | Admitting: Obstetrics

## 2023-08-29 DIAGNOSIS — A749 Chlamydial infection, unspecified: Secondary | ICD-10-CM

## 2023-08-29 NOTE — Telephone Encounter (Signed)
Pt has appt on the 30th for TOC.

## 2023-08-30 LAB — CERVICOVAGINAL ANCILLARY ONLY
Bacterial Vaginitis (gardnerella): NEGATIVE
Candida Glabrata: NEGATIVE
Candida Vaginitis: NEGATIVE
Chlamydia: POSITIVE — AB
Comment: NEGATIVE
Comment: NEGATIVE
Comment: NEGATIVE
Comment: NEGATIVE
Comment: NEGATIVE
Comment: NORMAL
Neisseria Gonorrhea: NEGATIVE
Trichomonas: NEGATIVE

## 2023-09-01 MED ORDER — ZITHROMAX 1 G PO PACK: 1.0000 g | PACK | Freq: Once | ORAL | 0 refills | Status: AC

## 2023-09-02 ENCOUNTER — Other Ambulatory Visit: Payer: Self-pay

## 2023-09-02 DIAGNOSIS — A749 Chlamydial infection, unspecified: Secondary | ICD-10-CM

## 2023-09-02 MED ORDER — AZITHROMYCIN 500 MG PO TABS: 1000.0000 mg | ORAL_TABLET | Freq: Once | ORAL | 0 refills | Status: AC

## 2023-09-05 ENCOUNTER — Ambulatory Visit: Payer: Medicaid Other

## 2023-09-13 DIAGNOSIS — B009 Herpesviral infection, unspecified: Secondary | ICD-10-CM

## 2023-09-13 MED ORDER — VALACYCLOVIR HCL 500 MG PO TABS: 500.0000 mg | ORAL_TABLET | Freq: Every day | ORAL | 10 refills | Status: DC

## 2023-09-24 DIAGNOSIS — R350 Frequency of micturition: Secondary | ICD-10-CM | POA: Diagnosis not present

## 2023-09-24 DIAGNOSIS — N390 Urinary tract infection, site not specified: Secondary | ICD-10-CM | POA: Diagnosis not present

## 2023-09-24 DIAGNOSIS — Z3202 Encounter for pregnancy test, result negative: Secondary | ICD-10-CM | POA: Diagnosis not present

## 2023-09-24 DIAGNOSIS — R319 Hematuria, unspecified: Secondary | ICD-10-CM | POA: Diagnosis not present

## 2023-10-02 DIAGNOSIS — N390 Urinary tract infection, site not specified: Secondary | ICD-10-CM | POA: Diagnosis not present

## 2023-10-02 DIAGNOSIS — R399 Unspecified symptoms and signs involving the genitourinary system: Secondary | ICD-10-CM | POA: Diagnosis not present

## 2023-12-10 DIAGNOSIS — Z202 Contact with and (suspected) exposure to infections with a predominantly sexual mode of transmission: Secondary | ICD-10-CM | POA: Diagnosis not present

## 2024-08-01 ENCOUNTER — Other Ambulatory Visit: Payer: Self-pay | Admitting: Advanced Practice Midwife

## 2024-08-05 ENCOUNTER — Other Ambulatory Visit: Payer: Self-pay | Admitting: Family Medicine

## 2024-08-05 DIAGNOSIS — Z0000 Encounter for general adult medical examination without abnormal findings: Secondary | ICD-10-CM

## 2024-08-05 DIAGNOSIS — Z3041 Encounter for surveillance of contraceptive pills: Secondary | ICD-10-CM

## 2024-08-08 ENCOUNTER — Other Ambulatory Visit: Payer: Self-pay

## 2024-08-08 DIAGNOSIS — Z3041 Encounter for surveillance of contraceptive pills: Secondary | ICD-10-CM

## 2024-08-08 MED ORDER — LO LOESTRIN FE 1 MG-10 MCG / 10 MCG PO TABS: 1.0000 | ORAL_TABLET | Freq: Every day | ORAL | 0 refills | Status: AC

## 2024-08-19 ENCOUNTER — Encounter: Payer: Self-pay | Admitting: Certified Nurse Midwife

## 2024-08-20 ENCOUNTER — Other Ambulatory Visit: Payer: Self-pay | Admitting: Certified Nurse Midwife

## 2024-08-20 ENCOUNTER — Other Ambulatory Visit: Payer: Self-pay

## 2024-08-20 DIAGNOSIS — Z3041 Encounter for surveillance of contraceptive pills: Secondary | ICD-10-CM

## 2024-08-20 MED ORDER — LO LOESTRIN FE 1 MG-10 MCG / 10 MCG PO TABS: 1.0000 | ORAL_TABLET | Freq: Every day | ORAL | 0 refills | Status: DC

## 2024-08-20 NOTE — Progress Notes (Signed)
 Insurance will only cover 90 day supply per pt.  Rx adjusted until pt can be seen 09/14/24.

## 2024-08-21 ENCOUNTER — Other Ambulatory Visit: Payer: Self-pay | Admitting: Licensed Practical Nurse

## 2024-08-21 DIAGNOSIS — Z30011 Encounter for initial prescription of contraceptive pills: Secondary | ICD-10-CM

## 2024-08-21 MED ORDER — DROSPIRENONE-ETHINYL ESTRADIOL 3-0.03 MG PO TABS: 1.0000 | ORAL_TABLET | Freq: Every day | ORAL | 11 refills | Status: DC

## 2024-08-21 NOTE — Progress Notes (Signed)
 [facility] called regarding LoLoestrin Fe, her insurance does not cover this medication even with the coupon the cost is 130 dollars for the pt. Her insurance may cover generic Yasmin .  Dominika called, unable to leave message, mychart message sent asking if she would like Yasmin . Will order Yasmin  once Hamsini confirms she would like this.  Jinnie Cookey, CNM  [facility] 08/21/2024  9:37 AM

## 2024-09-13 NOTE — Progress Notes (Signed)
 "   ANNUAL EXAM Patient name: Barbara Barnes MRN [id number]  Date of birth: Jan 13, 2001 Chief Complaint:   Annual Exam  History of Present Illness:   Jill Swiderski is a 24 y.o. G0P0000 African-American female being seen today for a routine annual exam.  Current complaints: discuss changing contraception-unable to get LoLoEstrin filled due to insurance not paying, Yaz causing nausea. Had HSV outbreak 2 weeks ago, continues to have irritation to left labia majora in one area, this is painful & causing her to not be able to sleep.  Sexually active: yes Number of sexual partners: 1 Gender of sexual Partners: male Dyspareunia? no Last pap:was normal  History of abnormal Pap: Yes, ASCUS, HPV negative 2023 Gardasil series:  reports received Flu vaccine: declined TDAP: declined  Health Maintenance > Reviewed breast self-awareness > History of abnormal mammogram: No > Exercise: basketball, weight ligting > Seat Belt Use: Yes.   > Safe in current relationship? Yes. > Concern for alcohol abuse? No   Patient's last menstrual period was 09/12/2024 (exact date).   Period Duration (Days): 5 Period Pattern: (!) Irregular Menstrual Flow: Light Menstrual Control: Maxi pad, Tampon Menstrual Control Change Freq (Hours): 2-4 Dysmenorrhea: (!) Moderate Dysmenorrhea Symptoms: Cramping   Upstream - 09/14/24 0824       Pregnancy Intention Screening   Does the patient want to become pregnant in the next year? No    Does the patient's partner want to become pregnant in the next year? No    Would the patient like to discuss contraceptive options today? Yes      Contraception Wrap Up   Current Method Oral Contraceptive    End Method Oral Contraceptive    Contraception Counseling Provided No         The pregnancy intention screening data noted above was reviewed. Potential methods of contraception were discussed. The patient elected to proceed with Oral Contraceptive.      Component Value  Date/Time   DIAGPAP  07/27/2023 0922    - Negative for intraepithelial lesion or malignancy (NILM)   DIAGPAP (A) 03/05/2022 1113    - Atypical squamous cells of undetermined significance (ASC-US )   DIAGPAP - Low grade squamous intraepithelial lesion (LSIL) (A) 02/13/2021 1435   HPVHIGH Negative 03/05/2022 1113   HPVHIGH Negative 02/13/2021 1435   ADEQPAP  07/27/2023 0922    Satisfactory for evaluation; transformation zone component PRESENT.   ADEQPAP  03/05/2022 1113    Satisfactory for evaluation; transformation zone component PRESENT.   ADEQPAP  02/13/2021 1435    Satisfactory for evaluation; transformation zone component ABSENT.      Last pap 07/27/23. Results were: normal. H/O abnormal pap: no Last mammogram: N/A. Results were: N/A. Family h/o breast cancer: no Last colonoscopy: N/A. Results were: normal. Family h/o colorectal cancer: no     Past Medical History:  Diagnosis Date   Asthma     Family History  Problem Relation Age of Onset   Rheum arthritis Neg Hx    Review of Systems:   Pertinent items are noted in HPI Denies any headaches, blurred vision, fatigue, shortness of breath, chest pain, abdominal pain, abnormal vaginal discharge/itching/odor/irritation, problems with periods, bowel movements, urination, or intercourse unless otherwise stated above. Pertinent History Reviewed:  Reviewed past medical,surgical, social and family history.  Reviewed problem list, medications and allergies. Physical Assessment:   Vitals:   09/14/24 0826  BP: 113/60  Pulse: 71  Weight: 132 lb 9.6 oz (60.1 kg)  Height: 5' 4 (1.626 m)  Body mass index is 22.76 kg/m.       Physical Exam Vitals reviewed.  Constitutional:      General: She is not in acute distress.    Appearance: Normal appearance.  HENT:     Head: Normocephalic.  Neck:     Thyroid : No thyroid  mass or thyromegaly.  Cardiovascular:     Rate and Rhythm: Normal rate and regular rhythm.     Heart sounds:  Normal heart sounds.  Pulmonary:     Effort: Pulmonary effort is normal.     Breath sounds: Normal breath sounds.  Abdominal:     General: Abdomen is flat.     Palpations: Abdomen is soft.     Tenderness: There is no abdominal tenderness.  Genitourinary:    Tanner stage (genital): 5.     Labia:        Right: No lesion.        Left: Tenderness and lesion present.       Comments: Left labia majora with tender, swollen area ~1x1cm, tissue intact, Musculoskeletal:     Cervical back: Neck supple. No tenderness.  Lymphadenopathy:     Upper Body:     Right upper body: No axillary adenopathy.     Left upper body: No axillary adenopathy.  Skin:    General: Skin is warm and dry.  Neurological:     General: No focal deficit present.     Mental Status: She is alert and oriented to person, place, and time.  Psychiatric:        Mood and Affect: Mood normal.        Behavior: Behavior normal.      No results found for this or any previous visit (from the past 24 hours).  Assessment & Plan:  1. Well woman exam with routine gynecological exam (Primary)  2. Herpes - valACYclovir  (VALTREX ) 500 MG tablet; Take 1 tablet (500 mg total) by mouth daily.  Dispense: 90 tablet; Refill: 10  3. Screen for sexually transmitted diseases - Cervicovaginal ancillary only  4. Local skin infection - Secondary skin infection to site of HSV outbreak, lidocaine  for pain management & oral Keflex  prescribed.  Mammogram: @ 25yo, or sooner if problems Colonoscopy: @ 25yo, or sooner if problems  No orders of the defined types were placed in this encounter.   Meds:  Meds ordered this encounter  Medications   Norethindrone Acetate-Ethinyl Estrad-FE (LOESTRIN  24 FE) 1-20 MG-MCG(24) tablet    Sig: Take 1 tablet by mouth at bedtime.    Dispense:  90 tablet    Refill:  4   valACYclovir  (VALTREX ) 500 MG tablet    Sig: Take 1 tablet (500 mg total) by mouth daily.    Dispense:  90 tablet    Refill:  10    lidocaine  (XYLOCAINE ) 5 % ointment    Sig: Apply 1 Application topically 3 (three) times daily as needed for mild pain (pain score 1-3).    Dispense:  35.44 g    Refill:  0   cephALEXin  (KEFLEX ) 500 MG capsule    Sig: Take 1 capsule (500 mg total) by mouth 4 (four) times daily for 7 days.    Dispense:  28 capsule    Refill:  0    Follow-up: Return in 1 year (on 09/14/2025) for Annual exam.  Harlene LITTIE Cisco, CNM 09/14/2024 9:37 AM  "

## 2024-09-13 NOTE — Patient Instructions (Addendum)
 Preventive Care 46-25 Years Old, Female Preventive care refers to lifestyle choices and visits with your health care provider that can promote health and wellness. Preventive care visits are also called wellness exams. What can I expect for my preventive care visit? Counseling During your preventive care visit, your health care provider may ask about your: Medical history, including: Past medical problems. Family medical history. Pregnancy history. Current health, including: Menstrual cycle. Method of birth control. Emotional well-being. Home life and relationship well-being. Sexual activity and sexual health. Lifestyle, including: Alcohol, nicotine or tobacco, and drug use. Access to firearms. Diet, exercise, and sleep habits. Work and work Astronomer. Sunscreen use. Safety issues such as seatbelt and bike helmet use. Physical exam Your health care provider may check your: Height and weight. These may be used to calculate your BMI (body mass index). BMI is a measurement that tells if you are at a healthy weight. Waist circumference. This measures the distance around your waistline. This measurement also tells if you are at a healthy weight and may help predict your risk of certain diseases, such as type 2 diabetes and high blood pressure. Heart rate and blood pressure. Body temperature. Skin for abnormal spots. What immunizations do I need?  Vaccines are usually given at various ages, according to a schedule. Your health care provider will recommend vaccines for you based on your age, medical history, and lifestyle or other factors, such as travel or where you work. What tests do I need? Screening Your health care provider may recommend screening tests for certain conditions. This may include: Pelvic exam and Pap test. Lipid and cholesterol levels. Diabetes screening. This is done by checking your blood sugar (glucose) after you have not eaten for a while (fasting). Hepatitis  B test. Hepatitis C test. HIV (human immunodeficiency virus) test. STI (sexually transmitted infection) testing, if you are at risk. BRCA-related cancer screening. This may be done if you have a family history of breast, ovarian, tubal, or peritoneal cancers. Talk with your health care provider about your test results, treatment options, and if necessary, the need for more tests. Follow these instructions at home: Eating and drinking  Eat a healthy diet that includes fresh fruits and vegetables, whole grains, lean protein, and low-fat dairy products. Take vitamin and mineral supplements as recommended by your health care provider. Do not drink alcohol if: Your health care provider tells you not to drink. You are pregnant, may be pregnant, or are planning to become pregnant. If you drink alcohol: Limit how much you have to 0-1 drink a day. Know how much alcohol is in your drink. In the [place] one drink equals one 12 oz bottle of beer (355 mL), one 5 oz glass of wine (148 mL), or one 1 oz glass of hard liquor (44 mL). Lifestyle Brush your teeth every morning and night with fluoride toothpaste. Floss one time each day. Exercise for at least 30 minutes 5 or more days each week. Do not use any products that contain nicotine or tobacco. These products include cigarettes, chewing tobacco, and vaping devices, such as e-cigarettes. If you need help quitting, ask your health care provider. Do not use drugs. If you are sexually active, practice safe sex. Use a condom or other form of protection to prevent STIs. If you do not wish to become pregnant, use a form of birth control. If you plan to become pregnant, see your health care provider for a prepregnancy visit. Find healthy ways to manage stress, such as: Meditation,  yoga, or listening to music. Journaling. Talking to a trusted person. Spending time with friends and family. Minimize exposure to UV radiation to reduce your risk of skin  cancer. Safety Always wear your seat belt while driving or riding in a vehicle. Do not drive: If you have been drinking alcohol. Do not ride with someone who has been drinking. If you have been using any mind-altering substances or drugs. While texting. When you are tired or distracted. Wear a helmet and other protective equipment during sports activities. If you have firearms in your house, make sure you follow all gun safety procedures. Seek help if you have been physically or sexually abused. What's next? Go to your health care provider once a year for an annual wellness visit. Ask your health care provider how often you should have your eyes and teeth checked. Stay up to date on all vaccines. This information is not intended to replace advice given to you by your health care provider. Make sure you discuss any questions you have with your health care provider. Document Revised: 02/18/2021 Document Reviewed: 02/18/2021 Elsevier Patient Education  2024 [employer]  How to Do a Breast Self-Exam Doing breast self-exams can help you stay healthy. They're one way to know what's normal for your breasts. They can help you catch a problem while it's still small and can be treated. You need to: Check your breasts often. Tell your doctor about any changes. You should do breast self-exams even if you have breast implants. What you need: A mirror. A well-lit room. A pillow or other soft object. How to do a breast self-exam Look for changes  Take off all the clothes above your waist. Stand in front of a mirror in a room with good lighting. Put your hands down at your sides. Compare your breasts in the mirror. Look for difference between them, such as: Differences in shape. Differences in size. Wrinkles, dips, and bumps in one breast and not the other. Look at each breast for skin changes, such as: Redness. Scaly spots. Spots where your skin is thicker. Dimpling. Open sores. Look  for changes in your nipples, such as: Fluid coming out of a nipple. Fluid around a nipple. Bleeding. Dimpling. Redness. A nipple that looks pushed in or that has changed position. Feel for changes Lie on your back. Feel each breast. To do this: Pick a breast to feel. Place a pillow under the shoulder closest to that breast. Put the arm closest to that breast behind your head. Feel the breast using the hand of your other arm. Use the pads of your three middle fingers to make small circles starting near the nipple. Use light, medium, and firm pressure. Keep making circles, moving down over the breast. Stop when you feel your ribs. Start making circles with your fingers again, this time going up until you reach your collarbone. Then, make circles out across your breast and into your armpit area. Squeeze your nipple. Check for fluid and lumps. Do these steps again to check your other breast. Sit or stand in the tub or shower. With soapy water on your skin, feel each breast the same way you did when you were lying down. Write down what you find Writing down what you find can help you keep track of what you want to tell your doctor. Write down: What's normal for each breast. Any changes you find. Write down: The kind of change. If your breast feels tender or painful. Any lump you find.  Write down its size and where it is. When you last had your period. General tips If you're breastfeeding, the best time to check your breasts is after you feed your baby or after you use a breast pump. If you get a period, the best time to check your breasts is 5-7 days after your period ends. With time, you'll get more used to doing the self-exam. You'll also start to know if there are changes in your breasts. Contact a doctor if: You see a change in the shape or size of your breasts or nipples. You see a change in the skin of your breast or nipples. You have fluid coming from your nipples that isn't  normal. You find a new lump or thick area. You have breast pain. You have any concerns about your breast health. This information is not intended to replace advice given to you by your health care provider. Make sure you discuss any questions you have with your health care provider. Document Revised: 11/02/2023 Document Reviewed: 11/02/2023 Elsevier Patient Education  2025 [employer]. Health Maintenance, Female Adopting a healthy lifestyle and getting preventive care are important in promoting health and wellness. Ask your health care provider about: The right schedule for you to have regular tests and exams. Things you can do on your own to prevent diseases and keep yourself healthy. What should I know about diet, weight, and exercise? Eat a healthy diet  Eat a diet that includes plenty of vegetables, fruits, low-fat dairy products, and lean protein. Do not eat a lot of foods that are high in solid fats, added sugars, or sodium. Maintain a healthy weight Body mass index (BMI) is used to identify weight problems. It estimates body fat based on height and weight. Your health care provider can help determine your BMI and help you achieve or maintain a healthy weight. Get regular exercise Get regular exercise. This is one of the most important things you can do for your health. Most adults should: Exercise for at least 150 minutes each week. The exercise should increase your heart rate and make you sweat (moderate-intensity exercise). Do strengthening exercises at least twice a week. This is in addition to the moderate-intensity exercise. Spend less time sitting. Even light physical activity can be beneficial. Watch cholesterol and blood lipids Have your blood tested for lipids and cholesterol at 25 years of age, then have this test every 5 years. Have your cholesterol levels checked more often if: Your lipid or cholesterol levels are high. You are older than 25 years of age. You are at  high risk for heart disease. What should I know about cancer screening? Depending on your health history and family history, you may need to have cancer screening at various ages. This may include screening for: Breast cancer. Cervical cancer. Colorectal cancer. Skin cancer. Lung cancer. What should I know about heart disease, diabetes, and high blood pressure? Blood pressure and heart disease High blood pressure causes heart disease and increases the risk of stroke. This is more likely to develop in people who have high blood pressure readings or are overweight. Have your blood pressure checked: Every 3-5 years if you are 25-76 years of age. Every year if you are 67 years old or older. Diabetes Have regular diabetes screenings. This checks your fasting blood sugar level. Have the screening done: Once every three years after age 16 if you are at a normal weight and have a low risk for diabetes. More often and at a  younger age if you are overweight or have a high risk for diabetes. What should I know about preventing infection? Hepatitis B If you have a higher risk for hepatitis B, you should be screened for this virus. Talk with your health care provider to find out if you are at risk for hepatitis B infection. Hepatitis C Testing is recommended for: Everyone born from [birth year] through 1966. Anyone with known risk factors for hepatitis C. Sexually transmitted infections (STIs) Get screened for STIs, including gonorrhea and chlamydia, if: You are sexually active and are younger than 25 years of age. You are older than 25 years of age and your health care provider tells you that you are at risk for this type of infection. Your sexual activity has changed since you were last screened, and you are at increased risk for chlamydia or gonorrhea. Ask your health care provider if you are at risk. Ask your health care provider about whether you are at high risk for HIV. Your health care provider may  recommend a prescription medicine to help prevent HIV infection. If you choose to take medicine to prevent HIV, you should first get tested for HIV. You should then be tested every 3 months for as long as you are taking the medicine. Pregnancy If you are about to stop having your period (premenopausal) and you may become pregnant, seek counseling before you get pregnant. Take 400 to 800 micrograms (mcg) of folic acid every day if you become pregnant. Ask for birth control (contraception) if you want to prevent pregnancy. Osteoporosis and menopause Osteoporosis is a disease in which the bones lose minerals and strength with aging. This can result in bone fractures. If you are 35 years old or older, or if you are at risk for osteoporosis and fractures, ask your health care provider if you should: Be screened for bone loss. Take a calcium or vitamin D  supplement to lower your risk of fractures. Be given hormone replacement therapy (HRT) to treat symptoms of menopause. Follow these instructions at home: Alcohol use Do not drink alcohol if: Your health care provider tells you not to drink. You are pregnant, may be pregnant, or are planning to become pregnant. If you drink alcohol: Limit how much you have to: 0-1 drink a day. Know how much alcohol is in your drink. In the [place] one drink equals one 12 oz bottle of beer (355 mL), one 5 oz glass of wine (148 mL), or one 1 oz glass of hard liquor (44 mL). Lifestyle Do not use any products that contain nicotine or tobacco. These products include cigarettes, chewing tobacco, and vaping devices, such as e-cigarettes. If you need help quitting, ask your health care provider. Do not use street drugs. Do not share needles. Ask your health care provider for help if you need support or information about quitting drugs. General instructions Schedule regular health, dental, and eye exams. Stay current with your vaccines. Tell your health care provider  if: You often feel depressed. You have ever been abused or do not feel safe at home. Summary Adopting a healthy lifestyle and getting preventive care are important in promoting health and wellness. Follow your health care provider's instructions about healthy diet, exercising, and getting tested or screened for diseases. Follow your health care provider's instructions on monitoring your cholesterol and blood pressure. This information is not intended to replace advice given to you by your health care provider. Make sure you discuss any questions you have with your health care  provider. Document Revised: 01/12/2021 Document Reviewed: 01/12/2021 Elsevier Patient Education  2024 [employer].

## 2024-09-14 ENCOUNTER — Ambulatory Visit: Admitting: Certified Nurse Midwife

## 2024-09-14 ENCOUNTER — Other Ambulatory Visit (HOSPITAL_COMMUNITY)
Admission: RE | Admit: 2024-09-14 | Discharge: 2024-09-14 | Disposition: A | Source: Ambulatory Visit | Attending: Certified Nurse Midwife | Admitting: Certified Nurse Midwife

## 2024-09-14 ENCOUNTER — Encounter: Payer: Self-pay | Admitting: Certified Nurse Midwife

## 2024-09-14 VITALS — BP 113/60 | HR 71 | Ht 64.0 in | Wt 132.6 lb

## 2024-09-14 DIAGNOSIS — B009 Herpesviral infection, unspecified: Secondary | ICD-10-CM

## 2024-09-14 DIAGNOSIS — Z113 Encounter for screening for infections with a predominantly sexual mode of transmission: Secondary | ICD-10-CM | POA: Insufficient documentation

## 2024-09-14 DIAGNOSIS — L089 Local infection of the skin and subcutaneous tissue, unspecified: Secondary | ICD-10-CM

## 2024-09-14 DIAGNOSIS — Z01419 Encounter for gynecological examination (general) (routine) without abnormal findings: Secondary | ICD-10-CM

## 2024-09-14 DIAGNOSIS — Z01411 Encounter for gynecological examination (general) (routine) with abnormal findings: Secondary | ICD-10-CM

## 2024-09-14 MED ORDER — LIDOCAINE 5 % EX OINT: 1.0000 | TOPICAL_OINTMENT | Freq: Three times a day (TID) | CUTANEOUS | 0 refills | Status: AC | PRN

## 2024-09-14 MED ORDER — NORETHIN ACE-ETH ESTRAD-FE 1-20 MG-MCG(24) PO TABS: 1.0000 | ORAL_TABLET | Freq: Every day | ORAL | 4 refills | Status: AC

## 2024-09-14 MED ORDER — CEPHALEXIN 500 MG PO CAPS: 500.0000 mg | ORAL_CAPSULE | Freq: Four times a day (QID) | ORAL | 0 refills | Status: AC

## 2024-09-14 MED ORDER — VALACYCLOVIR HCL 500 MG PO TABS: 500.0000 mg | ORAL_TABLET | Freq: Every day | ORAL | 10 refills | Status: AC

## 2024-09-17 LAB — CERVICOVAGINAL ANCILLARY ONLY
Chlamydia: NEGATIVE
Comment: NEGATIVE
Comment: NEGATIVE
Comment: NORMAL
Neisseria Gonorrhea: NEGATIVE
Trichomonas: NEGATIVE

## 2024-09-19 ENCOUNTER — Ambulatory Visit: Payer: Self-pay | Admitting: Certified Nurse Midwife
